# Patient Record
Sex: Male | Born: 1945 | Race: White | Hispanic: No | Marital: Married | Smoking: Never smoker
Health system: Southern US, Community
[De-identification: ages and names within clinical notes are randomized; demographics above are authoritative.]

## PROBLEM LIST (undated history)

## (undated) DIAGNOSIS — R0602 Shortness of breath: Secondary | ICD-10-CM

## (undated) DIAGNOSIS — I2510 Atherosclerotic heart disease of native coronary artery without angina pectoris: Secondary | ICD-10-CM

## (undated) DIAGNOSIS — Z951 Presence of aortocoronary bypass graft: Secondary | ICD-10-CM

## (undated) DIAGNOSIS — G4733 Obstructive sleep apnea (adult) (pediatric): Secondary | ICD-10-CM

## (undated) DIAGNOSIS — T4145XA Adverse effect of unspecified anesthetic, initial encounter: Secondary | ICD-10-CM

## (undated) DIAGNOSIS — J45909 Unspecified asthma, uncomplicated: Secondary | ICD-10-CM

## (undated) DIAGNOSIS — I214 Non-ST elevation (NSTEMI) myocardial infarction: Secondary | ICD-10-CM

## (undated) DIAGNOSIS — I10 Essential (primary) hypertension: Secondary | ICD-10-CM

## (undated) DIAGNOSIS — Z9989 Dependence on other enabling machines and devices: Secondary | ICD-10-CM

## (undated) DIAGNOSIS — T8859XA Other complications of anesthesia, initial encounter: Secondary | ICD-10-CM

## (undated) DIAGNOSIS — I350 Nonrheumatic aortic (valve) stenosis: Secondary | ICD-10-CM

## (undated) HISTORY — PX: CHOLECYSTECTOMY: SHX55

## (undated) HISTORY — PX: CARPAL TUNNEL RELEASE: SHX101

## (undated) HISTORY — PX: TRANSTHORACIC ECHOCARDIOGRAM: SHX275

## (undated) HISTORY — DX: Nonrheumatic aortic (valve) stenosis: I35.0

## (undated) HISTORY — PX: HERNIA REPAIR: SHX51

## (undated) HISTORY — PX: SHOULDER OPEN ROTATOR CUFF REPAIR: SHX2407

## (undated) HISTORY — PX: INCISION AND DRAINAGE OF WOUND: SHX1803

## (undated) HISTORY — DX: Non-ST elevation (NSTEMI) myocardial infarction: I21.4

---

## 2010-11-25 DIAGNOSIS — E6609 Other obesity due to excess calories: Secondary | ICD-10-CM | POA: Insufficient documentation

## 2012-06-14 DIAGNOSIS — N529 Male erectile dysfunction, unspecified: Secondary | ICD-10-CM | POA: Insufficient documentation

## 2013-04-08 ENCOUNTER — Encounter (HOSPITAL_COMMUNITY): Payer: Self-pay | Admitting: Emergency Medicine

## 2013-04-08 ENCOUNTER — Other Ambulatory Visit: Payer: Self-pay

## 2013-04-08 ENCOUNTER — Inpatient Hospital Stay (HOSPITAL_COMMUNITY): Payer: Medicare Other

## 2013-04-08 ENCOUNTER — Inpatient Hospital Stay (HOSPITAL_COMMUNITY)
Admission: EM | Admit: 2013-04-08 | Discharge: 2013-04-15 | DRG: 234 | Disposition: A | Discharge status: Home / Self Care | Payer: Medicare Other | Attending: Cardiothoracic Surgery | Admitting: Cardiothoracic Surgery

## 2013-04-08 ENCOUNTER — Emergency Department (HOSPITAL_COMMUNITY): Payer: Medicare Other

## 2013-04-08 ENCOUNTER — Emergency Department (HOSPITAL_COMMUNITY): Admission: EM | Admit: 2013-04-08 | Payer: Self-pay

## 2013-04-08 DIAGNOSIS — E66811 Obesity, class 1: Secondary | ICD-10-CM | POA: Diagnosis present

## 2013-04-08 DIAGNOSIS — G4730 Sleep apnea, unspecified: Secondary | ICD-10-CM | POA: Diagnosis present

## 2013-04-08 DIAGNOSIS — D62 Acute posthemorrhagic anemia: Secondary | ICD-10-CM | POA: Diagnosis not present

## 2013-04-08 DIAGNOSIS — Z6836 Body mass index (BMI) 36.0-36.9, adult: Secondary | ICD-10-CM

## 2013-04-08 DIAGNOSIS — E785 Hyperlipidemia, unspecified: Secondary | ICD-10-CM | POA: Diagnosis present

## 2013-04-08 DIAGNOSIS — Z8709 Personal history of other diseases of the respiratory system: Secondary | ICD-10-CM | POA: Diagnosis present

## 2013-04-08 DIAGNOSIS — I214 Non-ST elevation (NSTEMI) myocardial infarction: Principal | ICD-10-CM | POA: Diagnosis present

## 2013-04-08 DIAGNOSIS — Z951 Presence of aortocoronary bypass graft: Secondary | ICD-10-CM

## 2013-04-08 DIAGNOSIS — I472 Ventricular tachycardia, unspecified: Secondary | ICD-10-CM | POA: Diagnosis present

## 2013-04-08 DIAGNOSIS — T6391XA Toxic effect of contact with unspecified venomous animal, accidental (unintentional), initial encounter: Secondary | ICD-10-CM | POA: Diagnosis present

## 2013-04-08 DIAGNOSIS — Z8249 Family history of ischemic heart disease and other diseases of the circulatory system: Secondary | ICD-10-CM

## 2013-04-08 DIAGNOSIS — T63301A Toxic effect of unspecified spider venom, accidental (unintentional), initial encounter: Secondary | ICD-10-CM

## 2013-04-08 DIAGNOSIS — E860 Dehydration: Secondary | ICD-10-CM | POA: Diagnosis present

## 2013-04-08 DIAGNOSIS — R55 Syncope and collapse: Secondary | ICD-10-CM | POA: Diagnosis present

## 2013-04-08 DIAGNOSIS — T63391A Toxic effect of venom of other spider, accidental (unintentional), initial encounter: Secondary | ICD-10-CM | POA: Diagnosis present

## 2013-04-08 DIAGNOSIS — I4729 Other ventricular tachycardia: Secondary | ICD-10-CM | POA: Diagnosis present

## 2013-04-08 DIAGNOSIS — I2510 Atherosclerotic heart disease of native coronary artery without angina pectoris: Secondary | ICD-10-CM | POA: Diagnosis present

## 2013-04-08 DIAGNOSIS — I10 Essential (primary) hypertension: Secondary | ICD-10-CM | POA: Diagnosis present

## 2013-04-08 DIAGNOSIS — G4733 Obstructive sleep apnea (adult) (pediatric): Secondary | ICD-10-CM | POA: Diagnosis present

## 2013-04-08 DIAGNOSIS — D696 Thrombocytopenia, unspecified: Secondary | ICD-10-CM | POA: Diagnosis not present

## 2013-04-08 DIAGNOSIS — T679XXA Effect of heat and light, unspecified, initial encounter: Secondary | ICD-10-CM

## 2013-04-08 DIAGNOSIS — I959 Hypotension, unspecified: Secondary | ICD-10-CM | POA: Diagnosis present

## 2013-04-08 DIAGNOSIS — J45909 Unspecified asthma, uncomplicated: Secondary | ICD-10-CM | POA: Diagnosis present

## 2013-04-08 DIAGNOSIS — I25119 Atherosclerotic heart disease of native coronary artery with unspecified angina pectoris: Secondary | ICD-10-CM | POA: Diagnosis present

## 2013-04-08 DIAGNOSIS — E669 Obesity, unspecified: Secondary | ICD-10-CM | POA: Diagnosis present

## 2013-04-08 DIAGNOSIS — R000 Tachycardia, unspecified: Secondary | ICD-10-CM

## 2013-04-08 DIAGNOSIS — J9819 Other pulmonary collapse: Secondary | ICD-10-CM | POA: Diagnosis not present

## 2013-04-08 DIAGNOSIS — E876 Hypokalemia: Secondary | ICD-10-CM | POA: Diagnosis not present

## 2013-04-08 HISTORY — DX: Presence of aortocoronary bypass graft: Z95.1

## 2013-04-08 HISTORY — DX: Unspecified asthma, uncomplicated: J45.909

## 2013-04-08 HISTORY — DX: Dependence on other enabling machines and devices: Z99.89

## 2013-04-08 HISTORY — DX: Other complications of anesthesia, initial encounter: T88.59XA

## 2013-04-08 HISTORY — DX: Essential (primary) hypertension: I10

## 2013-04-08 HISTORY — DX: Atherosclerotic heart disease of native coronary artery without angina pectoris: I25.10

## 2013-04-08 HISTORY — DX: Obstructive sleep apnea (adult) (pediatric): G47.33

## 2013-04-08 HISTORY — DX: Shortness of breath: R06.02

## 2013-04-08 HISTORY — DX: Adverse effect of unspecified anesthetic, initial encounter: T41.45XA

## 2013-04-08 HISTORY — DX: Non-ST elevation (NSTEMI) myocardial infarction: I21.4

## 2013-04-08 LAB — CBC WITH DIFFERENTIAL/PLATELET
Basophils Absolute: 0 10*3/uL (ref 0.0–0.1)
Basophils Relative: 0 % (ref 0–1)
HCT: 40.1 % (ref 39.0–52.0)
Lymphocytes Relative: 9 % — ABNORMAL LOW (ref 12–46)
MCHC: 35.2 g/dL (ref 30.0–36.0)
Neutro Abs: 12.6 10*3/uL — ABNORMAL HIGH (ref 1.7–7.7)
Neutrophils Relative %: 76 % (ref 43–77)
Platelets: 159 10*3/uL (ref 150–400)
RDW: 12.7 % (ref 11.5–15.5)
WBC: 16.6 10*3/uL — ABNORMAL HIGH (ref 4.0–10.5)

## 2013-04-08 LAB — CK: Total CK: 371 U/L — ABNORMAL HIGH (ref 7–232)

## 2013-04-08 LAB — BASIC METABOLIC PANEL
BUN: 16 mg/dL (ref 6–23)
CO2: 27 mEq/L (ref 19–32)
Chloride: 105 mEq/L (ref 96–112)
Creatinine, Ser: 1.3 mg/dL (ref 0.50–1.35)
Creatinine, Ser: 1.26 mg/dL (ref 0.50–1.35)
GFR calc Af Amer: 65 mL/min — ABNORMAL LOW (ref >90)
GFR calc Af Amer: 67 mL/min — ABNORMAL LOW (ref >90)
GFR calc non Af Amer: 58 mL/min — ABNORMAL LOW (ref >90)
Potassium: 2.9 mEq/L — ABNORMAL LOW (ref 3.5–5.1)
Potassium: 3.4 mEq/L — ABNORMAL LOW (ref 3.5–5.1)
Sodium: 142 mEq/L (ref 135–145)

## 2013-04-08 LAB — TROPONIN I
Troponin I: 0.39 ng/mL (ref <0.30)
Troponin I: 0.56 ng/mL (ref <0.30)

## 2013-04-08 LAB — GLUCOSE, CAPILLARY: Glucose-Capillary: 105 mg/dL — ABNORMAL HIGH (ref 70–99)

## 2013-04-08 MED ORDER — SODIUM CHLORIDE 0.9 % IJ SOLN: 3.0000 mL | Freq: Two times a day (BID) | INTRAMUSCULAR | Status: DC

## 2013-04-08 MED ORDER — SODIUM CHLORIDE 0.9 % IV SOLN
INTRAVENOUS | Status: DC
  Administered 2013-04-08: via INTRAVENOUS

## 2013-04-08 MED ORDER — ACETAMINOPHEN 325 MG PO TABS
650.0000 mg | ORAL_TABLET | ORAL | Status: DC | PRN
  Filled 2013-04-08: qty 2

## 2013-04-08 MED ORDER — NITROGLYCERIN 0.4 MG SL SUBL: 0.4000 mg | SUBLINGUAL_TABLET | SUBLINGUAL | Status: DC | PRN

## 2013-04-08 MED ORDER — HEPARIN BOLUS VIA INFUSION
4000.0000 [IU] | Freq: Once | INTRAVENOUS | Status: AC
  Administered 2013-04-09: 4000 [IU] via INTRAVENOUS
  Filled 2013-04-08: qty 4000

## 2013-04-08 MED ORDER — ASPIRIN EC 81 MG PO TBEC
81.0000 mg | DELAYED_RELEASE_TABLET | Freq: Every day | ORAL | Status: DC
  Administered 2013-04-09 – 2013-04-10 (×2): 81 mg via ORAL
  Filled 2013-04-08 (×3): qty 1

## 2013-04-08 MED ORDER — FENOFIBRATE 160 MG PO TABS
160.0000 mg | ORAL_TABLET | Freq: Every day | ORAL | Status: DC
  Administered 2013-04-09 – 2013-04-15 (×6): 160 mg via ORAL
  Filled 2013-04-08 (×7): qty 1

## 2013-04-08 MED ORDER — ONDANSETRON HCL 4 MG/2ML IJ SOLN
4.0000 mg | Freq: Four times a day (QID) | INTRAMUSCULAR | Status: DC | PRN
  Filled 2013-04-08: qty 2

## 2013-04-08 MED ORDER — METHYLPREDNISOLONE SODIUM SUCC 125 MG IJ SOLR
125.0000 mg | Freq: Once | INTRAMUSCULAR | Status: AC
  Administered 2013-04-08: 125 mg via INTRAVENOUS
  Filled 2013-04-08: qty 2

## 2013-04-08 MED ORDER — MONTELUKAST SODIUM 10 MG PO TABS
10.0000 mg | ORAL_TABLET | Freq: Every day | ORAL | Status: DC
  Administered 2013-04-09 – 2013-04-14 (×5): 10 mg via ORAL
  Filled 2013-04-08 (×8): qty 1

## 2013-04-08 MED ORDER — FLUTICASONE PROPIONATE 50 MCG/ACT NA SUSP
2.0000 | Freq: Every day | NASAL | Status: DC
  Administered 2013-04-09 – 2013-04-15 (×6): 2 via NASAL
  Filled 2013-04-08 (×2): qty 16

## 2013-04-08 MED ORDER — FAMOTIDINE IN NACL 20-0.9 MG/50ML-% IV SOLN
20.0000 mg | Freq: Once | INTRAVENOUS | Status: AC
  Administered 2013-04-08: 20 mg via INTRAVENOUS
  Filled 2013-04-08: qty 50

## 2013-04-08 MED ORDER — SODIUM CHLORIDE 0.9 % IV BOLUS (SEPSIS)
1000.0000 mL | Freq: Once | INTRAVENOUS | Status: AC
  Administered 2013-04-08: 1000 mL via INTRAVENOUS

## 2013-04-08 MED ORDER — DIPHENHYDRAMINE HCL 50 MG/ML IJ SOLN
25.0000 mg | Freq: Once | INTRAMUSCULAR | Status: AC
  Administered 2013-04-08: 25 mg via INTRAVENOUS
  Filled 2013-04-08: qty 1

## 2013-04-08 MED ORDER — SODIUM CHLORIDE 0.9 % IJ SOLN: 3.0000 mL | INTRAMUSCULAR | Status: DC | PRN

## 2013-04-08 MED ORDER — POTASSIUM CHLORIDE CRYS ER 20 MEQ PO TBCR
40.0000 meq | EXTENDED_RELEASE_TABLET | Freq: Once | ORAL | Status: AC
  Administered 2013-04-08: 40 meq via ORAL
  Filled 2013-04-08: qty 2

## 2013-04-08 MED ORDER — ALBUTEROL SULFATE HFA 108 (90 BASE) MCG/ACT IN AERS
2.0000 | INHALATION_SPRAY | Freq: Four times a day (QID) | RESPIRATORY_TRACT | Status: DC | PRN
Start: 2013-04-08 — End: 2013-04-15

## 2013-04-08 MED ORDER — HEPARIN (PORCINE) IN NACL 100-0.45 UNIT/ML-% IJ SOLN
1850.0000 [IU]/h | INTRAMUSCULAR | Status: DC
  Administered 2013-04-09: 1700 [IU]/h via INTRAVENOUS
  Filled 2013-04-08 (×3): qty 250

## 2013-04-08 MED ORDER — SODIUM CHLORIDE 0.9 % IV SOLN: 250.0000 mL | INTRAVENOUS | Status: DC | PRN

## 2013-04-08 NOTE — ED Notes (Signed)
Per EMS pt was working outside and got stung by unknown amount of bees. Pt was found inside by family in chair unresponsive.  Pt now lethargic and unable to recall what happened.  Pt has 18g in right AC, 20g in left wrist.  Pt has no previous allergy to bees.  Pt was sinus tach on EMS initial arrival at scene, pt HR decreased to 103bpm and RR rate has decreased since arrival at scene. Pt was also diaphoretic at scene which has subsided now.  Pt is nauseated and received 8mg  zofran in route, without any relief.  Denies any respiratory difficulty.

## 2013-04-08 NOTE — ED Provider Notes (Signed)
CSN: [id number]     Arrival date & time 04/08/13  1352 History   First MD Initiated Contact with Patient 04/08/13 1500     Chief Complaint  Patient presents with  . Fatigue  . Loss of Consciousness  . Insect Bite    bee stings  . Nausea   (Consider location/radiation/quality/duration/timing/severity/associated sxs/prior Treatment) HPI  Patient and his wife report they were working today to help her sister move. They were up in the attic and moving items out of the house and loading them into a trailer. She reports they've been outside working a lot and it was free hot today (it was 93). At one point he picked up a container of plant clippings and he reports yellow jackets came out and started stinging him. He relates he was stung all over. He states he ran into the house. He states he weighs into the dining room and sat down and got something to drink. He states he got short of breath. He denies any itching. His family then noted he was not responding. They put him on the floor. His wife states his face was red. She states he was breathing and did have a pulse. She states he was sweating profusely however they had been out working in the heat. Patient states he remembers EMS arrival and shortly afterwards he did have vomiting. He did have a headache but it's gone now. He denies any chest pain before he had the syncopal episode or after. He complains of a dry mouth and he states after he got to the ER he was having chills. He was treated by EMS with Zofran. He denied feeling he was having any difficulty swallowing or having swelling in his throat or his tongue. He has some puffiness around his eyes but his wife states that the way he normally looks. He has no complaints of angioedema. He denies any itching. He has a couple small red areas consistent with bee stings. EMS reported some diffuse EKG changes which resolved in route to the ED.  PCP Dr Kern Reap in [facility]  Past Medical History  Diagnosis Date   . Asthma    Past Surgical History  Procedure Laterality Date  . Shoulder surgery    . Cholecystectomy    . Hernia repair    . Elbow surgery     No family history on file. History  Substance Use Topics  . Smoking status: Never Smoker   . Smokeless tobacco: Not on file  . Alcohol Use: No  retired Lives at home Lives with spouse  Review of Systems  All other systems reviewed and are negative.    Allergies  Review of patient's allergies indicates no known allergies.  Home Medications   Current Outpatient Rx  Name  Route  Sig  Dispense  Refill  . albuterol (PROVENTIL HFA;VENTOLIN HFA) 108 (90 BASE) MCG/ACT inhaler   Inhalation   Inhale 2 puffs into the lungs every 6 (six) hours as needed for wheezing or shortness of breath.         [name] albuterol (PROVENTIL) (2.5 MG/3ML) 0.083% nebulizer solution   Nebulization   Take 2.5 mg by nebulization every 6 (six) hours as needed for wheezing or shortness of breath.         . fenofibrate 160 MG tablet   Oral   Take 160 mg by mouth daily.         . fluticasone (FLONASE) 50 MCG/ACT nasal spray   Nasal  Place 2 sprays into the nose daily.         . hydrochlorothiazide (HYDRODIURIL) 25 MG tablet   Oral   Take 25 mg by mouth daily.         . montelukast (SINGULAIR) 10 MG tablet   Oral   Take 10 mg by mouth every morning.           BP 146/106  Pulse 111  Temp(Src) 98.1 F (36.7 C) (Oral)  Resp 20  SpO2 96%  Vital signs normal tachycardia  Physical Exam  Nursing note and vitals reviewed. Constitutional: He is oriented to person, place, and time. He appears well-developed and well-nourished.  Non-toxic appearance. He does not appear ill. No distress.  obese  HENT:  Head: Normocephalic and atraumatic.  Right Ear: External ear normal.  Left Ear: External ear normal.  Nose: Nose normal. No mucosal edema or rhinorrhea.  Mouth/Throat: Oropharynx is clear and moist and mucous membranes are normal. No dental  abscesses or edematous.  Eyes: Conjunctivae and EOM are normal. Pupils are equal, round, and reactive to light.  Neck: Normal range of motion and full passive range of motion without pain. Neck supple.  Cardiovascular: Normal rate, regular rhythm and normal heart sounds.  Exam reveals no gallop and no friction rub.   No murmur heard. Pulmonary/Chest: Effort normal and breath sounds normal. No respiratory distress. He has no wheezes. He has no rhonchi. He has no rales. He exhibits no tenderness and no crepitus.  Abdominal: Soft. Normal appearance and bowel sounds are normal. He exhibits no distension. There is no tenderness. There is no rebound and no guarding.  Musculoskeletal: Normal range of motion. He exhibits no edema and no tenderness.  Moves all extremities well.   Neurological: He is alert and oriented to person, place, and time. He has normal strength. No cranial nerve deficit.  Skin: Skin is warm, dry and intact. No rash noted. No erythema. No pallor.  Patient has an area of redness in his left temple, one small area on his left posterior back.  Psychiatric: He has a normal mood and affect. His speech is normal and behavior is normal. His mood appears not anxious.    ED Course  Procedures (including critical care time)  Medications  sodium chloride 0.9 % bolus 1,000 mL (0 mLs Intravenous Stopped 04/08/13 1622)  diphenhydrAMINE (BENADRYL) injection 25 mg (25 mg Intravenous Given 04/08/13 1617)  famotidine (PEPCID) IVPB 20 mg (0 mg Intravenous Stopped 04/08/13 1722)  methylPREDNISolone sodium succinate (SOLU-MEDROL) 125 mg/2 mL injection 125 mg (125 mg Intravenous Given 04/08/13 1619)  potassium chloride SA (K-DUR,KLOR-CON) CR tablet 40 mEq (40 mEq Oral Given 04/08/13 1722)    Discussed results of his initial troponin. Patient again denies having any chest pain prior to his syncopal episode or after. He does not have a history of heart problems.   He given results of his second troponin and  need to be admitted for further evaluation. He could have underlying CAD and with the hypotension when he had syncope had some ischemia.  Pt's syncope could have been from the bee stings although he had no itching, swelling in his throat or tongue and did not receive treatment for anaphylaxis by EMS when he presented to the ED and felt better. He has been working in the heat all day and specifically an attic and had dehydration and heat exhaustion as possible underlying etiologies for his syncope.   Pt's hypokalemia was treated with oral meds since  he was not having any more nausea.  20:20 Dr Mayford Knife here in the ED and is going to see the patient.   20:38 Dr Mayford Knife wants a head CT to be done and patient to be transferred to [facility] tele for Dr Tresa Endo attending.   Labs Review  Results for orders placed during the hospital encounter of 04/08/13  TROPONIN I      Result Value Range   Troponin I 0.39 (*) <0.30 ng/mL  CK      Result Value Range   Total CK 371 (*) 7 - 232 U/L  CBC WITH DIFFERENTIAL      Result Value Range   WBC 16.6 (*) 4.0 - 10.5 K/uL   RBC 4.65  4.22 - 5.81 MIL/uL   Hemoglobin 14.1  13.0 - 17.0 g/dL   HCT 16.1  09.6 - 04.5 %   MCV 86.2  78.0 - 100.0 fL   MCH 30.3  26.0 - 34.0 pg   MCHC 35.2  30.0 - 36.0 g/dL   RDW 40.9  81.1 - 91.4 %   Platelets 159  150 - 400 K/uL   Neutrophils Relative % 76  43 - 77 %   Neutro Abs 12.6 (*) 1.7 - 7.7 K/uL   Lymphocytes Relative 9 (*) 12 - 46 %   Lymphs Abs 1.5  0.7 - 4.0 K/uL   Monocytes Relative 15 (*) 3 - 12 %   Monocytes Absolute 2.5 (*) 0.1 - 1.0 K/uL   Eosinophils Relative 0  0 - 5 %   Eosinophils Absolute 0.0  0.0 - 0.7 K/uL   Basophils Relative 0  0 - 1 %   Basophils Absolute 0.0  0.0 - 0.1 K/uL  BASIC METABOLIC PANEL      Result Value Range   Sodium 142  135 - 145 mEq/L   Potassium 2.9 (*) 3.5 - 5.1 mEq/L   Chloride 105  96 - 112 mEq/L   CO2 27  19 - 32 mEq/L   Glucose, Bld 109 (*) 70 - 99 mg/dL   BUN 17  6 - 23 mg/dL    Creatinine, Ser 7.82  0.50 - 1.35 mg/dL   Calcium 9.5  8.4 - 95.6 mg/dL   GFR calc non Af Amer 56 (*) >90 mL/min   GFR calc Af Amer 65 (*) >90 mL/min  TROPONIN I      Result Value Range   Troponin I 0.56 (*) <0.30 ng/mL  GLUCOSE, CAPILLARY      Result Value Range   Glucose-Capillary 105 (*) 70 - 99 mg/dL   Laboratory interpretation all normal except + troponin that is rising.    Dg Chest Portable 1 View  04/08/2013   *RADIOLOGY REPORT*  Clinical Data: Syncope  PORTABLE CHEST - 1 VIEW  Comparison: None  Findings: Heart size upper normal.  Vascularity is normal.  Lungs are free of infiltrate effusion or mass.  IMPRESSION: No active cardiopulmonary abnormality.   Original Report Authenticated By: Janeece Riggers, M.D.    Ct Head Wo Contrast  04/08/2013   *RADIOLOGY REPORT*  Clinical Data: Fall with loss of consciousness.  CT HEAD WITHOUT CONTRAST  Technique:  Contiguous axial images were obtained from the base of the skull through the vertex without contrast.  Comparison: None.  Findings: Bone windows demonstrate no significant soft tissue swelling.  No skull fracture.  Clear paranasal sinuses and mastoid air cells.  Soft tissue windows demonstrate no  mass lesion, hemorrhage, hydrocephalus, acute infarct, intra-axial, or extra-axial  fluid collection.  IMPRESSION:  No acute intracranial abnormality.   Original Report Authenticated By: Jeronimo Greaves, M.D.     #1  Date: 04/08/2013  Rate: 99  Rhythm: normal sinus rhythm  QRS Axis: normal  Intervals: normal  ST/T Wave abnormalities: nonspecific T wave changes  Conduction Disutrbances:nonspecific intraventricular conduction delay  Narrative Interpretation:   Old EKG Reviewed: none available  #2  Date: 04/08/2013  Rate: 108  Rhythm: sinus tachycardia  QRS Axis: normal  Intervals: PR prolonged  ST/T Wave abnormalities: normal  Conduction Disutrbances:none  Narrative Interpretation:   Old EKG Reviewed: changes noted NSTWC no longer present.     EMS EKG 12:31  Date: 04/08/2013  Rate: 144  Rhythm: sinus tachycardia  QRS Axis: normal  Intervals: normal  ST/T Wave abnormalities: ST depression in septal, inferolat leads, ST elevation aVR  Conduction Disutrbances:none  Narrative Interpretation:   Old EKG Reviewed: none available     MDM   1. Syncopal episodes   2. NSTEMI (non-ST elevated myocardial infarction)   3. Heat exposure     Plan transfer to [facility].   Devoria Albe, MD, FACEP   CRITICAL CARE Performed by: Ward Givens Total critical care time: 35 min Critical care time was exclusive of separately billable procedures and treating other patients. Critical care was necessary to treat or prevent imminent or life-threatening deterioration. Critical care was time spent personally by me on the following activities: development of treatment plan with patient and/or surrogate as well as nursing, discussions with consultants, evaluation of patient's response to treatment, examination of patient, obtaining history from patient or surrogate, ordering and performing treatments and interventions, ordering and review of laboratory studies, ordering and review of radiographic studies, pulse oximetry and re-evaluation of patient's condition.    Ward Givens, MD 04/08/13 2325

## 2013-04-08 NOTE — ED Notes (Signed)
Per EMS pt was working out in yard and was stung by unknown amount/times from bees and was found unresponsive in chair by family member.  Pt was sinus tach on arrival at scene as well as tachypnea. Pt denies any respiratory distress.  Pt has 18g in right AC and 20g in left wrist.  Pt also c/o nausea and given zofran 8mg  in route but pt denying relief.  Pt did so some depression on EKG ran by EMS but subsided by time to arrived here on their EKG.

## 2013-04-08 NOTE — H&P (Addendum)
Admit date: 04/08/2013 Referring Physician Dr. Lynelle Doctor Primary Cardiologist NONE Chief complaint/reason for admission: syncope after bee stings  HPI: This is a 67yo obese WM in his USOH when he was over at his sister's house working outside.  He picked up a bucket filled with yellow jackets and got stung multiple times.  He became diaphoretic and went and sat down.  He started to drink a coke and felt nauseated and the next thing he knew he was lying on the floor with EMS there.  He denied any chest pain or SOB prior to the event.  He denies any chest pain in the past but does have DOE which he attributes to his obesity.  He denied any palpitations but when EMS got there he was tachycardic at 140bpm with St depression in the anterolateral precordial leads.  The ST depression resolved when the tachycardia resolved.  Currently he feels back to normal.  He was only given Zofran for nausea.  Cardiology was called due to elevated troponin.    PMH:    Past Medical History  Diagnosis Date  . Asthma     PSH:    Past Surgical History  Procedure Laterality Date  . Shoulder surgery    . Cholecystectomy    . Hernia repair    . Elbow surgery      ALLERGIES:   Review of patient's allergies indicates no known allergies.  Prior to Admit Meds:   (Not in a hospital admission) Family HX:   No family history on file. Social HX:    History   Social History  . Marital Status: Married    Spouse Name: N/A    Number of Children: N/A  . Years of Education: N/A   Occupational History  . Not on file.   Social History Main Topics  . Smoking status: Never Smoker   . Smokeless tobacco: Not on file  . Alcohol Use: No  . Drug Use: Not on file  . Sexual Activity: Not on file   Other Topics Concern  . Not on file   Social History Narrative  . No narrative on file     ROS:  All 11 ROS were addressed and are negative except what is stated in the HPI  PHYSICAL EXAM Filed Vitals:   04/08/13 1949  BP:  172/93  Pulse: 111  Temp: 97.9 F (36.6 C)  Resp: 23   General: Well developed, well nourished, in no acute distress Head: Eyes PERRLA, No xanthomas.   Normal cephalic and atramatic  Lungs:   Clear bilaterally to auscultation and percussion. Heart:   HRRR S1 S2 Pulses are 2+ & equal.2/6 SM at RUSB to LLSB            No carotid bruit. No JVD.  No abdominal bruits. No femoral bruits. Abdomen: Bowel sounds are positive, abdomen soft and non-tender without masses  Extremities:   No clubbing, cyanosis or edema.  DP +1 Neuro: Alert and oriented X 3. Psych:  Good affect, responds appropriately   Labs:   Lab Results  Component Value Date   WBC 16.6* 04/08/2013   HGB 14.1 04/08/2013   HCT 40.1 04/08/2013   MCV 86.2 04/08/2013   PLT 159 04/08/2013    Recent Labs Lab 04/08/13 1558  NA 142  K 2.9*  CL 105  CO2 27  BUN 17  CREATININE 1.30  CALCIUM 9.5  GLUCOSE 109*   Lab Results  Component Value Date   CKTOTAL 371* 04/08/2013  TROPONINI 0.56* 04/08/2013       Radiology:  pending  EKG:  #1 sinus tachycardia at 144bpm with ST depression in the anterolateral leads             #2 NSR with no ST changes  ASSESSMENT:  1.  Syncope most likely secondary to hypotension from dehydration +/- acute anaphylaxis to bee sting.  He had been working out in the extreme heat and became nauseated.   2.  Multiple bee stings - no evidence of anaphylaxis at this time and did not receive anything for acute allergy 3.  Elevated troponin most likely secondary to #1 but ST depression during sinus tachycardia concerning for underlying CAD with coronary ischemia 4. Obesity 5.  Heart murmur 6.  Hypokalemia secondary to dehydration 7.  Elevated WBC ? Stress induced   PLAN:   1.  Admit to tele 2.  Cycle cardiac enzymes 3.  Replete potassium 3.  2D echo eval heart murmur 4.  IV Heparin gtt until rule out complete 5.  NPO after midnight 6.  Further workup in am per Dr. Dorthey Sawyer, MD  04/08/2013   8:27 PM

## 2013-04-08 NOTE — Progress Notes (Signed)
Patient confirms his pcp is Dr. Ardelle Balls in [place].

## 2013-04-08 NOTE — Progress Notes (Signed)
ANTICOAGULATION CONSULT NOTE - Initial Consult  Pharmacy Consult for heparin  Indication: chest pain/ACS  No Known Allergies  Patient Measurements: Height: 5\' 10"  (177.8 cm) Weight: 265 lb 12.8 oz (120.566 kg) IBW/kg (Calculated) : 73 Heparin Dosing Weight:   Vital Signs: Temp: 97.8 F (36.6 C) (09/02 2329) Temp src: Oral (09/02 1949) BP: 152/70 mmHg (09/02 2329) Pulse Rate: 104 (09/02 2329)  Labs:  Recent Labs  04/08/13 1558 04/08/13 1746  HGB 14.1  --   HCT 40.1  --   PLT 159  --   CREATININE 1.30 1.26  CKTOTAL 371*  --   TROPONINI 0.39* 0.56*    Estimated Creatinine Clearance: 76.1 ml/min (by C-G formula based on Cr of 1.26).   Medical History: Past Medical History  Diagnosis Date  . Asthma     Medications:  Prescriptions prior to admission  Medication Sig Dispense Refill  . albuterol (PROVENTIL HFA;VENTOLIN HFA) 108 (90 BASE) MCG/ACT inhaler Inhale 2 puffs into the lungs every 6 (six) hours as needed for wheezing or shortness of breath.      [name] albuterol (PROVENTIL) (2.5 MG/3ML) 0.083% nebulizer solution Take 2.5 mg by nebulization every 6 (six) hours as needed for wheezing or shortness of breath.      . fenofibrate 160 MG tablet Take 160 mg by mouth daily.      . fluticasone (FLONASE) 50 MCG/ACT nasal spray Place 2 sprays into the nose daily.      . hydrochlorothiazide (HYDRODIURIL) 25 MG tablet Take 25 mg by mouth daily.      . montelukast (SINGULAIR) 10 MG tablet Take 10 mg by mouth every morning.         Assessment: 67 yo obese male with asthma was stung by multiple yellow jackkets while working outdoors. With sycopal episode-passsed out and tachy in the 140's with st depression troponin elevated. Heparin until r/o. No anticoags PTA Goal of Therapy:  Heparin level 0.3-0.7 units/ml Monitor platelets by anticoagulation protocol: Yes   Plan:  Heparin 4000 unitsx1 then 1700 units/hr. HL in 6 hours. Daily HL and cbc starting 9/4   Janice Coffin 04/08/2013,11:44 PM

## 2013-04-09 ENCOUNTER — Other Ambulatory Visit: Payer: Self-pay | Admitting: *Deleted

## 2013-04-09 ENCOUNTER — Encounter (HOSPITAL_COMMUNITY)
Admission: EM | Disposition: A | Discharge status: Home / Self Care | Payer: Medicare Other | Source: Home / Self Care | Attending: Cardiothoracic Surgery

## 2013-04-09 DIAGNOSIS — I517 Cardiomegaly: Secondary | ICD-10-CM

## 2013-04-09 DIAGNOSIS — R000 Tachycardia, unspecified: Secondary | ICD-10-CM | POA: Insufficient documentation

## 2013-04-09 DIAGNOSIS — I472 Ventricular tachycardia: Secondary | ICD-10-CM | POA: Diagnosis present

## 2013-04-09 DIAGNOSIS — I2510 Atherosclerotic heart disease of native coronary artery without angina pectoris: Secondary | ICD-10-CM

## 2013-04-09 DIAGNOSIS — I4729 Other ventricular tachycardia: Secondary | ICD-10-CM | POA: Insufficient documentation

## 2013-04-09 DIAGNOSIS — E669 Obesity, unspecified: Secondary | ICD-10-CM | POA: Diagnosis present

## 2013-04-09 DIAGNOSIS — R55 Syncope and collapse: Secondary | ICD-10-CM | POA: Diagnosis present

## 2013-04-09 DIAGNOSIS — I214 Non-ST elevation (NSTEMI) myocardial infarction: Secondary | ICD-10-CM | POA: Diagnosis present

## 2013-04-09 HISTORY — PX: CARDIAC CATHETERIZATION: SHX172

## 2013-04-09 HISTORY — PX: TRANSTHORACIC ECHOCARDIOGRAM: SHX275

## 2013-04-09 HISTORY — PX: LEFT HEART CATHETERIZATION WITH CORONARY ANGIOGRAM: SHX5451

## 2013-04-09 LAB — CBC
HCT: 37.8 % — ABNORMAL LOW (ref 39.0–52.0)
Hemoglobin: 14 g/dL (ref 13.0–17.0)
WBC: 19.2 10*3/uL — ABNORMAL HIGH (ref 4.0–10.5)

## 2013-04-09 LAB — COMPREHENSIVE METABOLIC PANEL
BUN: 18 mg/dL (ref 6–23)
Calcium: 9.5 mg/dL (ref 8.4–10.5)
GFR calc Af Amer: 64 mL/min — ABNORMAL LOW (ref >90)
Glucose, Bld: 202 mg/dL — ABNORMAL HIGH (ref 70–99)
Sodium: 144 mEq/L (ref 135–145)
Total Protein: 7 g/dL (ref 6.0–8.3)

## 2013-04-09 LAB — CBC WITH DIFFERENTIAL/PLATELET
Basophils Relative: 0 % (ref 0–1)
Eosinophils Absolute: 0 10*3/uL (ref 0.0–0.7)
Eosinophils Relative: 0 % (ref 0–5)
Lymphs Abs: 1.1 10*3/uL (ref 0.7–4.0)
MCH: 30.9 pg (ref 26.0–34.0)
MCHC: 35.8 g/dL (ref 30.0–36.0)
MCV: 86.3 fL (ref 78.0–100.0)
Monocytes Relative: 1 % — ABNORMAL LOW (ref 3–12)
Platelets: 165 10*3/uL (ref 150–400)
RBC: 4.6 MIL/uL (ref 4.22–5.81)

## 2013-04-09 LAB — PROTIME-INR
INR: 1.26 (ref 0.00–1.49)
Prothrombin Time: 15.5 seconds — ABNORMAL HIGH (ref 11.6–15.2)

## 2013-04-09 LAB — TROPONIN I: Troponin I: 0.47 ng/mL (ref <0.30)

## 2013-04-09 SURGERY — LEFT HEART CATHETERIZATION WITH CORONARY ANGIOGRAM
Anesthesia: LOCAL

## 2013-04-09 MED ORDER — SODIUM CHLORIDE 0.9 % IV SOLN: 250.0000 mL | INTRAVENOUS | Status: DC | PRN

## 2013-04-09 MED ORDER — VERAPAMIL HCL 2.5 MG/ML IV SOLN
INTRAVENOUS | Status: AC
  Filled 2013-04-09: qty 2

## 2013-04-09 MED ORDER — HEPARIN SODIUM (PORCINE) 1000 UNIT/ML IJ SOLN
INTRAMUSCULAR | Status: AC
  Filled 2013-04-09: qty 1

## 2013-04-09 MED ORDER — HEPARIN (PORCINE) IN NACL 100-0.45 UNIT/ML-% IJ SOLN
1850.0000 [IU]/h | INTRAMUSCULAR | Status: DC
  Administered 2013-04-10: 1850 [IU]/h via INTRAVENOUS
  Filled 2013-04-09 (×2): qty 250

## 2013-04-09 MED ORDER — SODIUM CHLORIDE 0.9 % IJ SOLN: 3.0000 mL | INTRAMUSCULAR | Status: DC | PRN

## 2013-04-09 MED ORDER — FENTANYL CITRATE 0.05 MG/ML IJ SOLN
INTRAMUSCULAR | Status: AC
  Filled 2013-04-09: qty 2

## 2013-04-09 MED ORDER — MORPHINE SULFATE 2 MG/ML IJ SOLN: 2.0000 mg | INTRAMUSCULAR | Status: DC | PRN

## 2013-04-09 MED ORDER — LIDOCAINE HCL (PF) 1 % IJ SOLN
INTRAMUSCULAR | Status: AC
  Filled 2013-04-09: qty 30

## 2013-04-09 MED ORDER — HEPARIN (PORCINE) IN NACL 2-0.9 UNIT/ML-% IJ SOLN
INTRAMUSCULAR | Status: AC
  Filled 2013-04-09: qty 1000

## 2013-04-09 MED ORDER — HEPARIN (PORCINE) IN NACL 100-0.45 UNIT/ML-% IJ SOLN
1850.0000 [IU]/h | INTRAMUSCULAR | Status: DC
  Filled 2013-04-09: qty 250

## 2013-04-09 MED ORDER — ATORVASTATIN CALCIUM 80 MG PO TABS
80.0000 mg | ORAL_TABLET | Freq: Every day | ORAL | Status: DC
Start: 2013-04-09 — End: 2013-04-15
  Administered 2013-04-09 – 2013-04-14 (×5): 80 mg via ORAL
  Filled 2013-04-09 (×8): qty 1

## 2013-04-09 MED ORDER — SODIUM CHLORIDE 0.9 % IJ SOLN: 3.0000 mL | Freq: Two times a day (BID) | INTRAMUSCULAR | Status: DC

## 2013-04-09 MED ORDER — MIDAZOLAM HCL 2 MG/2ML IJ SOLN
INTRAMUSCULAR | Status: AC
  Filled 2013-04-09: qty 2

## 2013-04-09 MED ORDER — SODIUM CHLORIDE 0.9 % IV SOLN
1.0000 mL/kg/h | INTRAVENOUS | Status: AC
  Administered 2013-04-09: 1 mL/kg/h via INTRAVENOUS

## 2013-04-09 MED ORDER — HEPARIN (PORCINE) IN NACL 100-0.45 UNIT/ML-% IJ SOLN
1850.0000 [IU]/h | INTRAMUSCULAR | Status: DC
  Filled 2013-04-09 (×3): qty 250

## 2013-04-09 MED ORDER — CARVEDILOL 3.125 MG PO TABS
3.1250 mg | ORAL_TABLET | Freq: Two times a day (BID) | ORAL | Status: DC
  Administered 2013-04-09 – 2013-04-10 (×3): 3.125 mg via ORAL
  Filled 2013-04-09 (×6): qty 1

## 2013-04-09 MED ORDER — NITROGLYCERIN 0.2 MG/ML ON CALL CATH LAB
INTRAVENOUS | Status: AC
  Filled 2013-04-09: qty 1

## 2013-04-09 NOTE — H&P (View-Only) (Signed)
[facility]  Subjective: Denies any chest pain. Only notes SOB with exertion.   Objective: Vital signs in last 24 hours: Temp:  [95.6 F (35.3 C)-98.2 F (36.8 C)] 98.2 F (36.8 C) (09/03 0336) Pulse Rate:  [95-111] 104 (09/02 2329) Resp:  [20-23] 20 (09/03 0336) BP: (125-172)/(66-106) 125/66 mmHg (09/03 0336) SpO2:  [96 %-99 %] 97 % (09/03 0336) Weight:  [265 lb 12.8 oz (120.566 kg)] 265 lb 12.8 oz (120.566 kg) (09/02 2329) Last BM Date: 04/08/13  Intake/Output from previous day:   Intake/Output this shift:    Medications Current Facility-Administered Medications  Medication Dose Route Frequency Provider Last Rate Last Dose  . 0.9 %  sodium chloride infusion   Intravenous Continuous Iva L Knapp, MD 100 mL/hr at 04/08/13 2337    . 0.9 %  sodium chloride infusion  250 mL Intravenous PRN Traci R Turner, MD      . acetaminophen (TYLENOL) tablet 650 mg  650 mg Oral Q4H PRN Traci R Turner, MD      . albuterol (PROVENTIL HFA;VENTOLIN HFA) 108 (90 BASE) MCG/ACT inhaler 2 puff  2 puff Inhalation Q6H PRN Traci R Turner, MD      . aspirin EC tablet 81 mg  81 mg Oral Daily Traci R Turner, MD      . fenofibrate tablet 160 mg  160 mg Oral Daily Traci R Turner, MD      . fluticasone (FLONASE) 50 MCG/ACT nasal spray 2 spray  2 spray Each Nare Daily Traci R Turner, MD      . heparin ADULT infusion 100 units/mL (25000 units/250 mL)  1,850 Units/hr Intravenous Continuous Thomas A Kelly, MD 18.5 mL/hr at 04/09/13 0728 1,850 Units/hr at 04/09/13 0728  . montelukast (SINGULAIR) tablet 10 mg  10 mg Oral QHS Traci R Turner, MD      . nitroGLYCERIN (NITROSTAT) SL tablet 0.4 mg  0.4 mg Sublingual Q5 Min x 3 PRN Traci R Turner, MD      . ondansetron (ZOFRAN) injection 4 mg  4 mg Intravenous Q6H PRN Traci R Turner, MD      . sodium chloride 0.9 % injection 3 mL  3 mL Intravenous Q12H Traci R Turner, MD      . sodium chloride 0.9 % injection 3 mL  3 mL Intravenous PRN Traci  R Turner, MD        PE: General appearance: alert, cooperative and no distress Neck: no carotid bruit Lungs: clear to auscultation bilaterally Heart: regular rate and rhythm, 2/6 SM at LUSB Abdomen: soft, non-tender; bowel sounds normal; no masses,  no organomegaly Extremities: no LEE Pulses: 2+ and symmetric Skin: warm and dry Neurologic: Grossly normal  Lab Results:   Recent Labs  04/08/13 1558 04/09/13 0009 04/09/13 0618  WBC 16.6* 13.9* 19.2*  HGB 14.1 14.2 14.0  HCT 40.1 39.7 37.8*  PLT 159 165 169   BMET  Recent Labs  04/08/13 1558 04/08/13 1746 04/09/13 0009  NA 142 140 144  K 2.9* 3.4* 3.6  CL 105 104 107  CO2 27 24 21  GLUCOSE 109* 117* 202*  BUN 17 16 18  CREATININE 1.30 1.26 1.32  CALCIUM 9.5 9.2 9.5   Cardiac Panel (last 3 results)  Recent Labs  04/08/13 1558 04/08/13 1746 04/09/13 0009 04/09/13 0618  CKTOTAL 371*  --   --   --   TROPONINI 0.39* 0.56* 0.47* <0.30    Assessment/Plan  Principal Problem:   NSTEMI (non-ST   elevated myocardial infarction) Active Problems:   Sinus tachycardia   Obesity, unspecified  Plan: S/p syncope. ? If secondary to anaphylaxis and dehydration vs NSTEMI. Troponin positive x 3 and peaked at 0.56. He denies any chest pain currently or prior to event yesterday. He has positive risk factors including obesity and family history of CAD/early MI. He has also had short burst of NSVT on telemetry.  Pt will likely need a diagnostic LHC to evaluate for ischemia. MD to follow.  Results of 2D echo pending.    LOS: 1 day    Shriyans Kuenzi M. Symphani Eckstrom, PA-C 04/09/2013 9:17 AM  I have seen and evaluated the patient this AM along with Brittany Chelsye Suhre, PA. I agree with her findings, examination as well as impression recommendations. Admitted by On-call Fellow for Dr. Kelly (although the pt was truly unassigned).   Very interesting scenario -- strong FH, HTN, HLD, OSA-CPAP & morbid obesity (?Metabolic Syndrome); has noted  increased DOE over past few months - believing it to be due to Obesity --> was helping sister-in-law moving yesterday in extreme heat & disrupted a nest of hornets, suffering multiple stings. Was hot & flushed, quite stressed --> went inside to "cool down" & was found down /unresponsive on the floor.  Awoke to EMS.   No Epi administered. --> to ER (en-route, EMS noted Stachy with ST-changes concerning for ishcemia).  IN ER felt better after benadryl & solumedrol (never had wheezing, SOB or CP, palpitations).  Troponin on initial eval was elevated - slow decline since.    No further Sx since arrival & feels well today.  Echo just completed (will need to review). Tele just as I was walking in demonstrated ~8-10 beat run of NSVT.  Impression:  Syncope with + troponin after stressful event is concerning for possible Takotsubo CM, however with VT on tele & his CRFs, cannot discount possibility of VT as etiology of syncope with a true Dx of NSTEMI.  Given his obesity & high likelihood of poor quality (2 day) Myoview, I feel that the most definitive means to delineate his anatomy is via invasive evaluation with Cardiac Cath / Angiography.    Performing MD:  HARDING,DAVID W, M.D., M.S.  Procedure:  LEFT HEART CATHETERIZATION WITH CORONARY ANGIOGRAPHY +/- PERCUTANEOUS CORONARY INTERVENTION.  The procedure with Risks/Benefits/Alternatives and Indications was reviewed with the patient and wife.  All questions were answered.    Risks / Complications include, but not limited to: Death, MI, CVA/TIA, VF/VT (with defibrillation), Bradycardia (need for temporary pacer placement), contrast induced nephropathy, bleeding / bruising / hematoma / pseudoaneurysm, vascular or coronary injury (with possible emergent CT or Vascular Surgery), adverse medication reactions, infection.    The patient and wife voice understanding and agree to proceed.    MD Time with pt: 20 min  HARDING,DAVID W, M.D., M.S. [facility] [address] [place], [place]  [address]  [phone] Pager # [phone] 04/09/2013 10:44 AM     

## 2013-04-09 NOTE — Progress Notes (Deleted)
  Echocardiogram  2D Echocardiogram has been performed.  David Hogan 04/09/2013, 1:26 PM

## 2013-04-09 NOTE — Progress Notes (Signed)
Echocardiogram 2D Echocardiogram has been performed.  Dorothey Baseman 04/09/2013, 1:25 PM
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
## 2013-04-09 NOTE — Interval H&P Note (Signed)
History and Physical Interval Note:  04/09/2013 10:57 AM  Irven Easterly  has presented today for surgery, with the diagnosis of Non-STEMI / Syncope.  The various methods of treatment have been discussed with the patient and family. After consideration of risks, benefits and other options for treatment, the patient has consented to  Procedure(s): LEFT HEART CATHETERIZATION WITH CORONARY ANGIOGRAM (N/A) & POSSIBLE PERCUTANEOUS CORONARY INTERVENTION as a surgical intervention .  The patient's history has been reviewed, patient examined, no change in status, stable for surgery.  I have reviewed the patient's chart and labs.  Questions were answered to the patient's satisfaction.     HARDING,DAVID W  Cath Lab Visit (complete for each Cath Lab visit)  Clinical Evaluation Leading to the Procedure:   ACS: yes  Non-ACS:    Anginal Classification: No Symptoms  Anti-ischemic medical therapy: No Therapy  Non-Invasive Test Results: No non-invasive testing performed  Prior CABG: No previous CABG

## 2013-04-09 NOTE — CV Procedure (Signed)
CARDIAC CATHETERIZATION REPORT  NAME:  David Hogan   MRN: [id number] DOB:  13-Jun-1946   ADMIT DATE: 04/08/2013 Procedure Date: 04/09/2013  INTERVENTIONAL CARDIOLOGIST: Marykay Lex, M.D., MS PRIMARY CARE PROVIDER: Ardelle Balls, MD PRIMARY CARDIOLOGIST: Marykay Lex, M.D., MS  PATIENT:  David Hogan is a 67 y.o. male with a strong PMH of CAD, HTN, HLD, Obesity & OSA-CPAP who presented with mild Troponin Elevation following an unwitnessed syncopal event.  ECG upon initial evaluation noted STachy with ST depressions.  Initial Troponin levels were elevated.  Telemetry this AM showed brief run of NSVT.   PRE-OPERATIVE DIAGNOSIS:    NSTEMI  SYNCOPE  NON-SUSTAINED VENTRICULAR TACHYCARDIA  PROCEDURES PERFORMED:    LEFT HEART CATHETERIZATION WITH CORONARY ANGIOGRAPHY  PROCEDURE:Consent:  Risks of procedure as well as the alternatives and risks of each were explained to the (patient/caregiver).  Consent for procedure obtained. Consent for signed by MD and patient with RN witness -- placed on chart.   PROCEDURE: The patient was brought to the 2nd Floor [facility] in the fasting state and prepped and draped in the usual sterile fashion for Right groin or radial access. A modified Allen's test with plethysmography was performed, revealing excellent Ulnar artery collateral flow.  Sterile technique was used including antiseptics, cap, gloves, gown, hand hygiene, mask and sheet.  Skin prep: Chlorhexidine.  Time Out: Verified patient identification, verified procedure, site/side was marked, verified correct patient position, special equipment/implants available, medications/allergies/relevent history reviewed, required imaging and test results available.  Performed  Access: Right Radial  Artery; 6 Fr Sheath --   Seldinger technique (Angiocath Micropuncture Kit)  Radial Cocktail - 10 mL IA, IV Heparin 5000 units  Diagnostic:  5 Fr TIG 4.0 catheter advanced  and  exchanged over long exchange safety J. wire   Left Coronary Artery Angiography:5 Fr TIG 4.0, 6 Fr XB LAD 3.5   Right Coronary Artery Angiography: JR 4, unsuccessful, but unable to pass No Torque catheter; EZ Rad Right  LV Hemodynamics (LV Gram): JR 4   TR Band:  1425 Hours, 14  mL air  MEDICATIONS:  Anesthesia:  Local Lidocaine to  ml  Sedation:  1  mg IV Versed, 25  mcg IV fentanyl ;   Omnipaque Contrast: 180  ml, several additional views were taken with the guide catheter and for the LAD/ramus visualization.    Anticoagulation:  IV Heparin 5000 Units   Anti-Platelet Agent:  ASA 81 mg  Hemodynamics:  Central Aortic / Mean Pressures: 135/82 mmHg; 102 mmHg  Left Ventricular Pressures / EDP: 138/12 mmHg; 25 mmHg  Left Ventriculography: Not done - Echo just performed.  Coronary Anatomy:  Left Main: Very large caliber vessel that trifurcates into the LAD, Ramus Intermedius, and codominant Circumflex; there is mild distal narrowing, but otherwise angiographically normal.   LAD: Large-caliber vessel with focal ostial 80% followed by 70% proximal stenosis.  The vessel then gives off a septal perforator and a first diagonal branch.  It then wraps down around the apex with no further significant disease.    D1: Very proximal branch.  It bifurcates into larger and smaller branches.  The larger branches a moderate caliber vessel with a 60-70% lesion just at the bifurcation.    Left Circumflex: Very large caliber, codominant vessel.  Gives rise to several small obtuse marginal branches before it bifurcates into a large lateral obtuse marginal 1 in the left posterior lateral system with a small AV groove continuation.  Minimal to no angiographic evidence  of CAD in the entire circumflex system.    OM1: This is a large lateral branch that bifurcates distally around the inferolateral border.    Left Posterior Lateral System: This followed branch of the circumflex is as large as a lateral OM.   It gives rise to several small posterior lateral branches, at least 3.    Ramus intermedius: Moderate to large caliber vessel that bifurcates in the mid vessel covering a large portion of the anterolateral wall which would be the first obtuse marginal location.  There is ostial and proximal 99% stenosis.   RCA: Large caliber, codominant vessel with a downward takeoff.  There is mild ostial 20% stenosis.  The vessel terminates as a large posterior descending artery became the apex.  There is a diminutive  Right Posterior AV Groove Branch (RPAV).  Angiographically normal.     PATIENT DISPOSITION:    The patient was transferred to the PACU holding area in a hemodynamicaly stable, chest pain free condition.  The patient tolerated the procedure well, and there were no complications.  EBL:   < 10  ml  The patient was stable before, during, and after the procedure.  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  Moderately elevated LVEDP  PLAN OF CARE:  CT Surgery Consult -- Dr. Tyrone Sage to see  Add statin & BB (Carvedilol given NSVT on Tele)  Based on what could be potentially an unstable presentation with syncope with V. Tach they cannot be excluded as potential etiology, it would be more appropriate the patient maintained in the hospital for monitoring and not discharged home for outpatient CABG.  Restart IV heparin.     Marykay Lex, M.D., M.S. [facility] [address] [place], [place]  [address]  [phone]  04/09/2013 2:40 PM

## 2013-04-09 NOTE — Consult Note (Signed)
[address]       [place] [address]             [phone]        [facility] Medical Record #[id number] Date of Birth: 03/18/1946  Referring: David Lemma MD Primary Care: David Balls, MD  Chief Complaint:   Syncope after multiple Hogan stings    History of Present Illness:      This is a 67 year old obese Caucasian male who was in his usual state of health until the afternoon of 04/08/2013.  He was over at his sister's house helping his sister move. He picked up a container that was filled with yellow jackets and  got stung multiple times. He then ran into the house. He became diaphoretic and had some shortness of breath. He went and sat down. He got something to drink and then felt nauseated. His family noticed he was not responsive so they placed him on the floor. Apparently, his face was red, he was breathing, and had a pulse. EMS was called.The next thing he knew, he was lying on the floor with EMS there.  He denied any palpitations or chest pain. He also denied any difficulty swallowing or itching.He was tachycardic at 140bpm with ST depression in the anterolateral precordial leads. The ST depression resolved when the tachycardia resolved. He was only given Zofran for nausea and benadryl and solumedrol.  He denies any chest pain in the past but does have dyspnea on exertion, which he attributes to his obesity. Peak Troponin I was 0.56. He ruled in for a NSTEMI. Cardiac catheterization done today by Dr. Herbie Hogan revealed a focal ostial 80% followed by 70% proximal stenosis of the LAD, 60-70% diagonal, 99% stenosis of the ramus intermediate. Echocardiogram has been done. A cardiothoracic surgery consultation was requested forconsideration of coronary artery bypass grafting surgery.He was put on a heparin drip. He is currently chest pain free and is no acute distress.   Current Activity/ Functional Status: Patient is independent with mobility/ambulation,  transfers, ADL's, IADL's.   Zubrod Score: At the time of surgery this patient's most appropriate activity status/level should be described as: []  Normal activity, no symptoms [x]  Symptoms, fully ambulatory []  Symptoms, in bed less than or equal to 50% of the time []  Symptoms, in bed greater than 50% of the time but less than 100% []  Bedridden []  Moribund  Past Medical History  Diagnosis Date  . Asthma   . Complication of anesthesia     "woke up wild in recovery after gallbladder OR" (04/08/2013)  . Hypertension   . Heart murmur   . Exertional shortness of breath   . OSA on CPAP     Past Surgical History  Procedure Laterality Date  . Shoulder open rotator cuff repair Bilateral 1990's-2000's  . Incision and drainage of wound  ~ 1996    "hit elbow at work; it swelled up; had it opened up to get pus out" (04/08/2013)  . Cholecystectomy  ~ 2010  . Hernia repair  ~ 2012    "from the gallbladder OR" (04/08/2013)  . Carpal tunnel release Left 1990's    History  Smoking status  . Never Smoker   Smokeless tobacco  . Never Used    History  Alcohol Use  . Yes    Comment: 04/09/2013 "last drink was in 1978 or so; never had a problem w/it"    History   Social History  . Marital  Status: Married    Spouse Name: N/A    Number of Children: N/A  . Years of Education: N/A   Occupational History  . Retired   Social History Main Topics  . Smoking status: Never Smoker   . Smokeless tobacco: Never Used  . Alcohol Use: Yes     Comment: 04/09/2013 "last drink was in 1978 or so; never had a problem w/it"  . Drug Use: No  . Sexual Activity: Yes   Family History: Mother died in her 38's. She had an MI Father died at age 77. He had coronary artery disease and had a CABG 2 younger brothers are alive David Hogan has had an MI and had a stent; David Hogan has had an MI and had a CABG)   Allergies: No Known Allergies  Current Facility-Administered Medications  Medication Dose Route Frequency  Provider Last Rate Last Dose  . 0.9 %  sodium chloride infusion   Intravenous Continuous David Givens, MD 100 mL/hr at 04/08/13 2337    . 0.9 %  sodium chloride infusion  250 mL Intravenous PRN David Reichert, MD      . 0.9 %  sodium chloride infusion  1 mL/kg/hr Intravenous Continuous David Lex, MD      . acetaminophen (TYLENOL) tablet 650 mg  650 mg Oral Q4H PRN David Reichert, MD      . albuterol (PROVENTIL HFA;VENTOLIN HFA) 108 (90 BASE) MCG/ACT inhaler 2 puff  2 puff Inhalation Q6H PRN David Reichert, MD      . aspirin EC tablet 81 mg  81 mg Oral Daily David Reichert, MD   81 mg at 04/09/13 1241  . fenofibrate tablet 160 mg  160 mg Oral Daily David Reichert, MD      . fluticasone (FLONASE) 50 MCG/ACT nasal spray 2 spray  2 spray Each Nare Daily David Reichert, MD      . heparin ADULT infusion 100 units/mL (25000 units/250 mL)  1,850 Units/hr Intravenous Continuous David Bihari, MD   1,850 Units/hr at 04/09/13 [phone]  . montelukast (SINGULAIR) tablet 10 mg  10 mg Oral QHS David Reichert, MD      . nitroGLYCERIN (NITROSTAT) SL tablet 0.4 mg  0.4 mg Sublingual Q5 Min x 3 PRN David Reichert, MD      . ondansetron (ZOFRAN) injection 4 mg  4 mg Intravenous Q6H PRN David Reichert, MD      . sodium chloride 0.9 % injection 3 mL  3 mL Intravenous Q12H David R Turner, MD      . sodium chloride 0.9 % injection 3 mL  3 mL Intravenous PRN David Reichert, MD        Prescriptions prior to admission  Medication Sig Dispense Refill  . albuterol (PROVENTIL HFA;VENTOLIN HFA) 108 (90 BASE) MCG/ACT inhaler Inhale 2 puffs into the lungs every 6 (six) hours as needed for wheezing or shortness of breath.      [name] albuterol (PROVENTIL) (2.5 MG/3ML) 0.083% nebulizer solution Take 2.5 mg by nebulization every 6 (six) hours as needed for wheezing or shortness of breath.      . fenofibrate 160 MG tablet Take 160 mg by mouth daily.      . fluticasone (FLONASE) 50 MCG/ACT nasal spray Place 2 sprays into the nose  daily.      . hydrochlorothiazide (HYDRODIURIL) 25 MG tablet Take 25 mg by mouth daily.      . montelukast (SINGULAIR) 10 MG  tablet Take 10 mg by mouth every morning.          Review of Systems:     Cardiac Review of Systems: Y or N  Chest Pain [  n  ]  Resting SOB [  n ] Exertional SOB  [ y ]  Orthopnea [ n ]   Pedal Edema [  n ]    Palpitations [ n ] Syncope  Cove.Etienne  ]   Presyncope [  n ]  General Review of Systems: [Y] = yes [  ]=no Constitional: recent weight change [n  ]; anorexia [ n ]; fatigue [  n]; nausea [  y]; night sweats [  n]; fever [ n ]; or chills [  n]                                                                Eye : blurred vision[  n]; diplopia [ n  ]; vision changes [n  ];  Amaurosis fugax[ n ]; Resp: cough [ n ];  wheezing[n  ];  hemoptysis[ n ];  paroxysmal nocturnal dyspnea[ n ] GI:   vomiting[ n ];  dysphagia[ n ]; melena[  n];  hematochezia [n  ]; heartburn[n  ];   Hx of   GU: kidney stones [n  ]; hematuria[n  ];   dysuria [ n ];  nocturia[ n ];  history of  obstruction [n  ]; urinary frequency [ n ]             Skin: rash, swelling[ n ];, hair loss[ n ];  peripheral edema[n  ];  or itching[n  ]; Musculosketetal: myalgias[ n ];  joint swelling[n  ];  joint erythema[n  ];  joint pain[ n ];  back pain[ n ];  Heme/Lymph: bruising[ n ];  bleeding[ n ];  anemia[ n ];  Neuro: TIA[ n ];  headaches[n  ];  stroke[ n ];  vertigo[n  ];  seizures[ n ];   paresthesias[n  ];  difficulty walking[ n ];  Psych:depression[n  ]; anxiety[n  ];  Endocrine: diabetes[n  ];  thyroid dysfunction[ n ];    Physical Exam: BP 125/66  Pulse 108  Temp(Src) 98.2 F (36.8 C) (Oral)  Resp 20  Ht 5\' 10"  (1.778 m)  Wt 120.566 kg (265 lb 12.8 oz)  BMI 38.14 kg/m2  SpO2 97%  General appearance: alert, cooperative and no distress Neurologic: intact Heart: RRR, grade II/VI systolic murmur heard along left sternal border, no gallop Lungs: clear to auscultation bilaterally Abdomen: soft,  non-tender; bowel sounds normal; no masses,  no organomegaly Extremities: no cyanosis, clubbing, or edema. DP/PT palpable bilaterally HEENT: Head is normocephalic; Eyes-EOMI and PERRLA and sclera are non icteric; Neck is supple and no JVD, no carotid bruits   Diagnostic Studies & Laboratory data:     Recent Radiology Findings:   Ct Head Wo Contrast  04/08/2013   *RADIOLOGY REPORT*  Clinical Data: Fall with loss of consciousness.  CT HEAD WITHOUT CONTRAST  Technique:  Contiguous axial images were obtained from the base of the skull through the vertex without contrast.  Comparison: None.  Findings: Bone windows demonstrate no significant soft tissue swelling.  No skull fracture.  Clear paranasal sinuses and mastoid air cells.  Soft tissue windows demonstrate no  mass lesion, hemorrhage, hydrocephalus, acute infarct, intra-axial, or extra-axial fluid collection.  IMPRESSION:  No acute intracranial abnormality.   Original Report Authenticated By: Jeronimo Greaves, M.D.   Dg Chest Portable 1 View  04/08/2013   *RADIOLOGY REPORT*  Clinical Data: Syncope  PORTABLE CHEST - 1 VIEW  Comparison: None  Findings: Heart size upper normal.  Vascularity is normal.  Lungs are free of infiltrate effusion or mass.  IMPRESSION: No active cardiopulmonary abnormality.   Original Report Authenticated By: Janeece Riggers, M.D.      Recent Lab Findings: Lab Results  Component Value Date   WBC 19.2* 04/09/2013   HGB 14.0 04/09/2013   HCT 37.8* 04/09/2013   PLT 169 04/09/2013   GLUCOSE 202* 04/09/2013   ALT 52 04/09/2013   AST 46* 04/09/2013   NA 144 04/09/2013   K 3.6 04/09/2013   CL 107 04/09/2013   CREATININE 1.32 04/09/2013   BUN 18 04/09/2013   CO2 21 04/09/2013   INR 1.26 04/09/2013   ECHO: Study Conclusions  - Left ventricle: The cavity size was normal. There was severe concentric hypertrophy. Systolic function was vigorous. The estimated ejection fraction was in the range of 65% to 70%. Wall motion was normal; there were no regional  wall motion abnormalities. Doppler parameters are consistent with abnormal left ventricular relaxation (grade 1 diastolic dysfunction). The E/e' ratio is <10, suggesting normal LV filling pressure. - Aortic valve: Sclerosis without stenosis. Transvalvular velocity was minimally increased, owing to an increased LVOT velocity of 10 mmHg. No regurgitation. - Inferior vena cava: The vessel was normal in size; the respirophasic diameter changes were in the normal range (= 50%); findings are consistent with normal central venous pressure. Transthoracic echocardiography. M-mode, complete 2D, spectral Doppler, and color Doppler. Height: Height: 177.8cm. Height: 70in. Weight: Weight: 120.2kg. Weight: 264.4lb. Body mass index: BMI: 38kg/m^2. Body surface area: BSA: 2.45m^2. Blood pressure: 125/66. Patient status: Inpatient. Location: Bedside.  ------------------------------------------------------------  ------------------------------------------------------------ Left ventricle: The cavity size was normal. There was severe concentric hypertrophy. Systolic function was vigorous. The estimated ejection fraction was in the range of 65% to 70%. Wall motion was normal; there were no regional wall motion abnormalities. Doppler parameters are consistent with abnormal left ventricular relaxation (grade 1 diastolic dysfunction). The E/e' ratio is <10, suggesting normal LV filling pressure.  ------------------------------------------------------------ Aortic valve: Sclerosis without stenosis. Doppler: Transvalvular velocity was minimally increased, owing to an increased LVOT velocity of 10 mmHg. No regurgitation. VTI ratio of LVOT to aortic valve: 0.79. Indexed valve area: 1.4cm^2/m^2 (VTI). Peak velocity ratio of LVOT to aortic valve: 0.69. Indexed valve area: 1.21cm^2/m^2 (Vmax). Mean gradient: 11mm Hg (S). Peak gradient: 22mm Hg  (S).  ------------------------------------------------------------ Aorta: Aortic root: The aortic root was normal in size. Ascending aorta: The ascending aorta was normal in size.  ------------------------------------------------------------ Left atrium: The atrium was at the upper limits of normal in size.  ------------------------------------------------------------ Atrial septum: No defect or patent foramen ovale was identified.  ------------------------------------------------------------ Right ventricle: The cavity size was normal. Wall thickness was normal. Systolic function was normal.  ------------------------------------------------------------ Pulmonic valve: The valve appears to be grossly normal. Doppler: Trivial regurgitation.  ------------------------------------------------------------ Tricuspid valve: Poorly visualized. Doppler: Trivial regurgitation.  ------------------------------------------------------------ Pulmonary artery: Poorly visualized.  ------------------------------------------------------------ Right atrium: The atrium was normal in size.  ------------------------------------------------------------ Pericardium: There was no pericardial effusion.  ------------------------------------------------------------ Systemic veins: Inferior vena cava: The vessel was normal in size; the respirophasic diameter changes were in the normal range (= 50%);  findings are consistent with normal central venous pressure.  CATH: PATIENT: David Hogan is a 67 y.o. male with a strong PMH of CAD, HTN, HLD, Obesity & OSA-CPAP who presented with mild Troponin Elevation following an unwitnessed syncopal event. ECG upon initial evaluation noted STachy with ST depressions. Initial Troponin levels were elevated. Telemetry this AM showed brief run of NSVT.  PRE-OPERATIVE DIAGNOSIS:  NSTEMI  SYNCOPE  NON-SUSTAINED VENTRICULAR TACHYCARDIA PROCEDURES PERFORMED:  LEFT  HEART CATHETERIZATION WITH CORONARY ANGIOGRAPHY PROCEDURE:Consent: Risks of procedure as well as the alternatives and risks of each were explained to the (patient/caregiver). Consent for procedure obtained.  Consent for signed by MD and patient with RN witness -- placed on chart.  PROCEDURE: The patient was brought to the 2nd Floor [facility] in the fasting state and prepped and draped in the usual sterile fashion for Right groin or radial access. A modified Allen's test with plethysmography was performed, revealing excellent Ulnar artery collateral flow. Sterile technique was used including antiseptics, cap, gloves, gown, hand hygiene, mask and sheet. Skin prep: Chlorhexidine.  Time Out: Verified patient identification, verified procedure, site/side was marked, verified correct patient position, special equipment/implants available, medications/allergies/relevent history reviewed, required imaging and test results available. Performed  Access: Right Radial Artery; 6 Fr Sheath -- Seldinger technique (Angiocath Micropuncture Kit)  Radial Cocktail - 10 mL IA, IV Heparin 5000 units  Diagnostic: 5 Fr TIG 4.0 catheter advanced and exchanged over long exchange safety J. wire  Left Coronary Artery Angiography:5 Fr TIG 4.0, 6 Fr XB LAD 3.5  Right Coronary Artery Angiography: JR 4, unsuccessful, but unable to pass No Torque catheter; EZ Rad Right  LV Hemodynamics (LV Gram): JR 4  TR Band: 1425 Hours, 14 mL air  MEDICATIONS:  Anesthesia: Local Lidocaine to ml Sedation: 1 mg IV Versed, 25 mcg IV fentanyl ;  Omnipaque Contrast: 180 ml, several additional views were taken with the guide catheter and for the LAD/ramus visualization.  Anticoagulation: IV Heparin 5000 Units  Anti-Platelet Agent: ASA 81 mg Hemodynamics:  Central Aortic / Mean Pressures: 135/82 mmHg; 102 mmHg  Left Ventricular Pressures / EDP: 138/12 mmHg; 25 mmHg Left Ventriculography: Not done - Echo just performed.   Coronary Anatomy:  Left Main: Very large caliber vessel that trifurcates into the LAD, Ramus Intermedius, and codominant Circumflex; there is mild distal narrowing, but otherwise angiographically normal.  LAD: Large-caliber vessel with focal ostial 80% followed by 70% proximal stenosis. The vessel then gives off a septal perforator and a first diagonal branch. It then wraps down around the apex with no further significant disease.  D1: Very proximal branch. It bifurcates into larger and smaller branches. The larger branches a moderate caliber vessel with a 60-70% lesion just at the bifurcation.  Left Circumflex: Very large caliber, codominant vessel. Gives rise to several small obtuse marginal branches before it bifurcates into a large lateral obtuse marginal 1 in the left posterior lateral system with a small AV groove continuation. Minimal to no angiographic evidence of CAD in the entire circumflex system.  OM1: This is a large lateral branch that bifurcates distally around the inferolateral border.  Left Posterior Lateral System: This followed branch of the circumflex is as large as a lateral OM. It gives rise to several small posterior lateral branches, at least 3.  Ramus intermedius: Moderate to large caliber vessel that bifurcates in the mid vessel covering a large portion of the anterolateral wall which would be the first obtuse marginal location. There is  ostial and proximal 99% stenosis.  RCA: Large caliber, codominant vessel with a downward takeoff. There is mild ostial 20% stenosis. The vessel terminates as a large posterior descending artery became the apex. There is a diminutive Right Posterior AV Groove Branch (RPAV). Angiographically normal.  PATIENT DISPOSITION:  The patient was transferred to the PACU holding area in a hemodynamicaly stable, chest pain free condition.  The patient tolerated the procedure well, and there were no complications. EBL: < 10 ml  The patient was stable  before, during, and after the procedure. POST-OPERATIVE DIAGNOSIS:  Severe 2 Vessel CAD with Ostial LAD & Ostial Ramus stenoses not PCI amenable.  Moderately elevated LVEDP PLAN OF CARE:  CT Surgery Consult -- Dr. Tyrone Sage to see  Add statin & BB (Carvedilol given NSVT on Tele)  Based on what could be potentially an unstable presentation with syncope with V. Tach they cannot be excluded as potential etiology, it would be more appropriate the patient maintained in the hospital for monitoring and not discharged home for outpatient CABG.  Restart IV heparin.  David Hogan, M.D., M.S.  [facility]  [address]  [place], [place] [address]  [phone]   Assessment / Plan:      1.NSTEMI after stress of multiple yellow jacket stings 2.CAD with high grade prox lad disease not suitable  For  stent/angioplasty-will need CABG,  Friday.  3.Syncopal episode 4.NSVT   I have recommended proceeding with CABG  The goals risks and alternatives of the planned surgical procedure CABG have been discussed with the patient in detail. The risks of the procedure including death, infection, stroke, myocardial infarction, bleeding, blood transfusion have all been discussed specifically.  I have quoted David Hogan a 3 % of perioperative mortality and a complication rate as high as  20%. The patient's questions have been answered.David Hogan is willing  to proceed with the planned procedure.   Delight Ovens MD      [address] [place] [place] [address] Office [phone]   Beeper [phone]  04/09/2013 5:30 pm

## 2013-04-09 NOTE — Progress Notes (Signed)
Utilization review completed.  

## 2013-04-09 NOTE — Progress Notes (Signed)
ANTICOAGULATION CONSULT NOTE   Pharmacy Consult for heparin  Indication: chest pain/ACS  No Known Allergies  Labs:  Recent Labs  04/08/13 1558 04/08/13 1746 04/09/13 0009 04/09/13 0600 04/09/13 0618  HGB 14.1  --  14.2  --  14.0  HCT 40.1  --  39.7  --  37.8*  PLT 159  --  165  --  169  HEPARINUNFRC  --   --   --  0.28*  --   CREATININE 1.30 1.26 1.32  --   --   CKTOTAL 371*  --   --   --   --   TROPONINI 0.39* 0.56* 0.47*  --  <0.30    Estimated Creatinine Clearance: 72.6 ml/min (by C-G formula based on Cr of 1.32).  Assessment: 66 yo obese male with asthma was stung by multiple yellow jackkets while working outdoors. With sycopal episode-passsed out and tachy in the 140's with st depression troponin elevated. Heparin until r/o. No anticoags PTA.  Heparin level = 0.28 this AM  Goal of Therapy:  Heparin level 0.3-0.7 units/ml Monitor platelets by anticoagulation protocol: Yes   Plan:  1) Increase heparin to 1850 units / hr 2) Follow up AM heparin level, CBC  Thank you. Okey Regal, PharmD [phone]  04/09/2013,8:46 AM

## 2013-04-09 NOTE — Progress Notes (Deleted)
7:04 AM       HL slightly below goal at 0.28 with current rate of 1700 units/hr. No bleeding noted. Increasing to 1850 and recheck in 8 hours.   Janice Coffin

## 2013-04-09 NOTE — Progress Notes (Addendum)
Pharmacy Consult - Heparin  Resuming heparin 8 hours after sheath removal CVTS consult pending  Plan: 1) Heparin to begin at 2300 pm at 1850 units / hr 2) Follow up AM labs  Thank you. Okey Regal, PharmD [phone]  Addendum:  RN just called and said that the TR band was removed ~1815. So heparin should start 8 hrs post band removal.  Plan:  1) Will restart heparin 1850 units/hr at 0415 (8 hrs post TR band removal) 2) Heparin level 6 hours post restart 3) Daily HL/CBC  Christoper Fabian, PharmD, BCPS Clinical pharmacist, pager [phone] 04/09/2013  10:50 PM

## 2013-04-10 ENCOUNTER — Inpatient Hospital Stay (HOSPITAL_COMMUNITY): Payer: Medicare Other

## 2013-04-10 DIAGNOSIS — I214 Non-ST elevation (NSTEMI) myocardial infarction: Secondary | ICD-10-CM

## 2013-04-10 DIAGNOSIS — I472 Ventricular tachycardia, unspecified: Secondary | ICD-10-CM

## 2013-04-10 DIAGNOSIS — R55 Syncope and collapse: Secondary | ICD-10-CM

## 2013-04-10 LAB — COMPREHENSIVE METABOLIC PANEL
ALT: 42 U/L (ref 0–53)
AST: 40 U/L — ABNORMAL HIGH (ref 0–37)
Albumin: 3.9 g/dL (ref 3.5–5.2)
Alkaline Phosphatase: 39 U/L (ref 39–117)
BUN: 16 mg/dL (ref 6–23)
CO2: 26 mEq/L (ref 19–32)
Calcium: 8.7 mg/dL (ref 8.4–10.5)
Chloride: 104 mEq/L (ref 96–112)
Creatinine, Ser: 1.04 mg/dL (ref 0.50–1.35)
GFR calc Af Amer: 85 mL/min — ABNORMAL LOW (ref >90)
GFR calc non Af Amer: 73 mL/min — ABNORMAL LOW (ref >90)
Glucose, Bld: 98 mg/dL (ref 70–99)
Potassium: 3.4 mEq/L — ABNORMAL LOW (ref 3.5–5.1)
Sodium: 140 mEq/L (ref 135–145)
Total Bilirubin: 0.7 mg/dL (ref 0.3–1.2)
Total Protein: 6.9 g/dL (ref 6.0–8.3)

## 2013-04-10 LAB — CBC
HCT: 39.2 % (ref 39.0–52.0)
Hemoglobin: 13.9 g/dL (ref 13.0–17.0)
Hemoglobin: 14.2 g/dL (ref 13.0–17.0)
MCH: 31.6 pg (ref 26.0–34.0)
MCH: 31.5 pg (ref 26.0–34.0)
MCHC: 35.8 g/dL (ref 30.0–36.0)
MCHC: 36.2 g/dL — ABNORMAL HIGH (ref 30.0–36.0)
MCV: 86.9 fL (ref 78.0–100.0)
Platelets: 173 10*3/uL (ref 150–400)
Platelets: 169 10*3/uL (ref 150–400)
RBC: 4.51 MIL/uL (ref 4.22–5.81)
RDW: 13.1 % (ref 11.5–15.5)
WBC: 12.4 10*3/uL — ABNORMAL HIGH (ref 4.0–10.5)

## 2013-04-10 LAB — BLOOD GAS, ARTERIAL
Acid-Base Excess: 2 mmol/L (ref 0.0–2.0)
Bicarbonate: 25.7 mEq/L — ABNORMAL HIGH (ref 20.0–24.0)
Drawn by: 24610
O2 Saturation: 97.7 %
Patient temperature: 98.6
TCO2: 26.9 mmol/L (ref 0–100)
pCO2 arterial: 38.4 mmHg (ref 35.0–45.0)
pH, Arterial: 7.441 (ref 7.350–7.450)
pO2, Arterial: 79.2 mmHg — ABNORMAL LOW (ref 80.0–100.0)

## 2013-04-10 LAB — LIPID PANEL
Cholesterol: 152 mg/dL (ref 0–200)
HDL: 34 mg/dL — ABNORMAL LOW (ref >39)
LDL Cholesterol: 93 mg/dL (ref 0–99)
Total CHOL/HDL Ratio: 4.5 RATIO
Triglycerides: 125 mg/dL (ref <150)
VLDL: 25 mg/dL (ref 0–40)

## 2013-04-10 LAB — HEPARIN LEVEL (UNFRACTIONATED): Heparin Unfractionated: 0.37 IU/mL (ref 0.30–0.70)

## 2013-04-10 LAB — HEMOGLOBIN A1C
Hgb A1c MFr Bld: 5.4 % (ref <5.7)
Mean Plasma Glucose: 108 mg/dL (ref <117)

## 2013-04-10 LAB — PROTIME-INR
INR: 1.25 (ref 0.00–1.49)
Prothrombin Time: 15.4 seconds — ABNORMAL HIGH (ref 11.6–15.2)

## 2013-04-10 LAB — TYPE AND SCREEN
ABO/RH(D): A POS
Antibody Screen: NEGATIVE

## 2013-04-10 LAB — APTT: aPTT: 65 seconds — ABNORMAL HIGH (ref 24–37)

## 2013-04-10 MED ORDER — POTASSIUM CHLORIDE 2 MEQ/ML IV SOLN
80.0000 meq | INTRAVENOUS | Status: DC
  Filled 2013-04-10: qty 40

## 2013-04-10 MED ORDER — EPINEPHRINE HCL 1 MG/ML IJ SOLN
0.5000 ug/min | INTRAMUSCULAR | Status: DC
  Filled 2013-04-10: qty 4

## 2013-04-10 MED ORDER — SODIUM CHLORIDE 0.9 % IV SOLN
INTRAVENOUS | Status: DC
  Filled 2013-04-10: qty 30

## 2013-04-10 MED ORDER — SODIUM CHLORIDE 0.9 % IV SOLN
INTRAVENOUS | Status: DC
  Filled 2013-04-10: qty 40

## 2013-04-10 MED ORDER — NITROGLYCERIN IN D5W 200-5 MCG/ML-% IV SOLN
2.0000 ug/min | INTRAVENOUS | Status: DC
  Filled 2013-04-10: qty 250

## 2013-04-10 MED ORDER — PAPAVERINE HCL 30 MG/ML IJ SOLN
INTRAMUSCULAR | Status: AC
  Administered 2013-04-11: 09:00:00
  Filled 2013-04-10: qty 2.5

## 2013-04-10 MED ORDER — MUPIROCIN 2 % EX OINT
TOPICAL_OINTMENT | Freq: Two times a day (BID) | CUTANEOUS | Status: DC
  Administered 2013-04-10 (×2): via NASAL
  Filled 2013-04-10: qty 22

## 2013-04-10 MED ORDER — TEMAZEPAM 15 MG PO CAPS: 15.0000 mg | ORAL_CAPSULE | Freq: Once | ORAL | Status: AC | PRN

## 2013-04-10 MED ORDER — CHLORHEXIDINE GLUCONATE 4 % EX LIQD
60.0000 mL | Freq: Once | CUTANEOUS | Status: AC
Start: 2013-04-10 — End: 2013-04-10
  Administered 2013-04-10: 4 via TOPICAL
  Filled 2013-04-10: qty 60

## 2013-04-10 MED ORDER — METOPROLOL TARTRATE 12.5 MG HALF TABLET
12.5000 mg | ORAL_TABLET | Freq: Once | ORAL | Status: DC
  Filled 2013-04-10: qty 1

## 2013-04-10 MED ORDER — MAGNESIUM SULFATE 50 % IJ SOLN
40.0000 meq | INTRAMUSCULAR | Status: DC
  Filled 2013-04-10: qty 10

## 2013-04-10 MED ORDER — DEXTROSE 5 % IV SOLN
1.5000 g | INTRAVENOUS | Status: AC
  Administered 2013-04-11: 1.5 g via INTRAVENOUS
  Administered 2013-04-11: .75 g via INTRAVENOUS
  Filled 2013-04-10: qty 1.5

## 2013-04-10 MED ORDER — DEXMEDETOMIDINE HCL IN NACL 400 MCG/100ML IV SOLN
0.1000 ug/kg/h | INTRAVENOUS | Status: DC
  Filled 2013-04-10: qty 100

## 2013-04-10 MED ORDER — DEXTROSE 5 % IV SOLN
30.0000 ug/min | INTRAVENOUS | Status: DC
  Filled 2013-04-10: qty 2

## 2013-04-10 MED ORDER — HEPARIN (PORCINE) IN NACL 100-0.45 UNIT/ML-% IJ SOLN
1850.0000 [IU]/h | INTRAMUSCULAR | Status: DC
  Administered 2013-04-10 (×2): 1850 [IU]/h via INTRAVENOUS
  Filled 2013-04-10 (×4): qty 250

## 2013-04-10 MED ORDER — BISACODYL 5 MG PO TBEC
5.0000 mg | DELAYED_RELEASE_TABLET | Freq: Once | ORAL | Status: AC
  Administered 2013-04-10: 5 mg via ORAL
  Filled 2013-04-10: qty 1

## 2013-04-10 MED ORDER — SODIUM CHLORIDE 0.9 % IV SOLN
INTRAVENOUS | Status: DC
  Filled 2013-04-10: qty 1

## 2013-04-10 MED ORDER — ALBUTEROL SULFATE (5 MG/ML) 0.5% IN NEBU
2.5000 mg | INHALATION_SOLUTION | Freq: Once | RESPIRATORY_TRACT | Status: AC
  Administered 2013-04-10: 2.5 mg via RESPIRATORY_TRACT

## 2013-04-10 MED ORDER — DOPAMINE-DEXTROSE 3.2-5 MG/ML-% IV SOLN
2.0000 ug/kg/min | INTRAVENOUS | Status: DC
  Filled 2013-04-10: qty 250

## 2013-04-10 MED ORDER — DEXTROSE 5 % IV SOLN
750.0000 mg | INTRAVENOUS | Status: DC
  Filled 2013-04-10 (×2): qty 750

## 2013-04-10 MED ORDER — SODIUM CHLORIDE 0.9 % IV SOLN
1250.0000 mg | INTRAVENOUS | Status: AC
  Administered 2013-04-11: 1250 mg via INTRAVENOUS
  Filled 2013-04-10: qty 1250

## 2013-04-10 MED ORDER — CHLORHEXIDINE GLUCONATE 4 % EX LIQD
60.0000 mL | Freq: Once | CUTANEOUS | Status: AC
  Administered 2013-04-11: 4 via TOPICAL
  Filled 2013-04-10: qty 60

## 2013-04-10 NOTE — Progress Notes (Signed)
ANTICOAGULATION CONSULT NOTE   Pharmacy Consult for heparin  Indication: chest pain/ACS  No Known Allergies  Labs:  Recent Labs  04/08/13 1558 04/08/13 1746 04/09/13 0009 04/09/13 0600 04/09/13 0618 04/09/13 1100 04/10/13 0504 04/10/13 1220  HGB 14.1  --  14.2  --  14.0  --  13.9 14.2  HCT 40.1  --  39.7  --  37.8*  --  38.8* 39.2  PLT 159  --  165  --  169  --  173 169  APTT  --   --   --   --   --   --   --  65*  LABPROT  --   --   --   --   --  15.5*  --  15.4*  INR  --   --   --   --   --  1.26  --  1.25  HEPARINUNFRC  --   --   --  0.28*  --   --   --  0.37  CREATININE 1.30 1.26 1.32  --   --   --   --   --   CKTOTAL 371*  --   --   --   --   --   --   --   TROPONINI 0.39* 0.56* 0.47*  --  <0.30 0.35*  --   --     Estimated Creatinine Clearance: 72.6 ml/min (by C-G formula based on Cr of 1.32).  Assessment: 67 yo obese male with asthma was stung by multiple yellow jackkets while working outdoors. With sycopal episode-passsed out and tachy in the 140's with st depression troponin elevated. .  Heparin level = 0.37 today  Goal of Therapy:  Heparin level 0.3-0.7 units/ml Monitor platelets by anticoagulation protocol: Yes   Plan:  1) Continue heparin at 1850 units / hr 2) Follow up after CABG tomorrow  Thank you. Okey Regal, PharmD [phone]  04/10/2013,1:08 PM

## 2013-04-10 NOTE — Progress Notes (Signed)
[address]       [place] [address]             [phone]                 1 Day Post-Op Procedure(s) (LRB): LEFT HEART CATHETERIZATION WITH CORONARY ANGIOGRAM (N/A)  LOS: 2 days   Subjective: No cp today  Objective: Vital signs in last 24 hours: Patient Vitals for the past 24 hrs:  BP Temp Pulse Resp SpO2  04/10/13 1503 152/104 mmHg 97.7 F (36.5 C) 91 21 99 %  04/10/13 0523 141/88 mmHg 97.6 F (36.4 C) 84 - 98 %  04/09/13 2020 126/60 mmHg 98 F (36.7 C) 91 - 96 %    Filed Weights   04/08/13 2329  Weight: 265 lb 12.8 oz (120.566 kg)    Hemodynamic parameters for last 24 hours:    Intake/Output from previous day: 09/03 0701 - 09/04 0700 In: 360 [P.O.:360] Out: -  Intake/Output this shift: Total I/O In: 360 [P.O.:360] Out: -   Scheduled Meds: . [START ON 04/11/2013] aminocaproic acid (AMICAR) for OHS   Intravenous To OR  . aspirin EC  81 mg Oral Daily  . atorvastatin  80 mg Oral q1800  . carvedilol  3.125 mg Oral BID WC  . [START ON 04/11/2013] cefUROXime (ZINACEF)  IV  1.5 g Intravenous To OR  . [START ON 04/11/2013] cefUROXime (ZINACEF)  IV  750 mg Intravenous To OR  . chlorhexidine  60 mL Topical Once  . [START ON 04/11/2013] chlorhexidine  60 mL Topical Once  . [START ON 04/11/2013] dexmedetomidine  0.1-0.7 mcg/kg/hr Intravenous To OR  . [START ON 04/11/2013] DOPamine  2-20 mcg/kg/min Intravenous To OR  . [START ON 04/11/2013] epinephrine  0.5-20 mcg/min Intravenous To OR  . fenofibrate  160 mg Oral Daily  . fluticasone  2 spray Each Nare Daily  . [START ON 04/11/2013] heparin-papaverine-plasmalyte irrigation   Irrigation To OR  . [START ON 04/11/2013] heparin 30,000 units/NS 1000 mL solution for CELLSAVER   Other To OR  . [START ON 04/11/2013] insulin (NOVOLIN-R) infusion   Intravenous To OR  . [START ON 04/11/2013] magnesium sulfate  40 mEq Other To OR  . montelukast  10 mg Oral QHS  . mupirocin ointment   Nasal BID  . [START ON 04/11/2013]  nitroGLYCERIN  2-200 mcg/min Intravenous To OR  . [START ON 04/11/2013] phenylephrine (NEO-SYNEPHRINE) Adult infusion  30-200 mcg/min Intravenous To OR  . [START ON 04/11/2013] potassium chloride  80 mEq Other To OR  . sodium chloride  3 mL Intravenous Q12H  . sodium chloride  3 mL Intravenous Q12H  . [START ON 04/11/2013] vancomycin  1,250 mg Intravenous To OR   Continuous Infusions: . sodium chloride 100 mL/hr at 04/08/13 2337  . heparin 1,850 Units/hr (04/10/13 1738)   PRN Meds:.sodium chloride, sodium chloride, acetaminophen, albuterol, morphine injection, nitroGLYCERIN, ondansetron (ZOFRAN) IV, sodium chloride, sodium chloride, temazepam  General appearance: alert, cooperative and appears older than stated age Neurologic: intact Heart: regular rate and rhythm, S1, S2 normal, no murmur, click, rub or gallop Lungs: clear to auscultation bilaterally Abdomen: soft, non-tender; bowel sounds normal; no masses,  no organomegaly Extremities: extremities normal, atraumatic, no cyanosis or edema, Homans sign is negative, no sign of DVT and "spider bite on rt forearm unchanged from yesterday mild erythymia .  Lab Results: CBC: Recent Labs  04/10/13 0504 04/10/13 1220  WBC 13.4* 12.4*  HGB 13.9 14.2  HCT 38.8* 39.2  PLT 173 169   BMET:  Recent Labs  04/09/13 0009 04/10/13 1220  NA 144 140  K 3.6 3.4*  CL 107 104  CO2 21 26  GLUCOSE 202* 98  BUN 18 16  CREATININE 1.32 1.04  CALCIUM 9.5 8.7    PT/INR:  Recent Labs  04/10/13 1220  LABPROT 15.4*  INR 1.25     Radiology Ct Head Wo Contrast  04/08/2013   *RADIOLOGY REPORT*  Clinical Data: Fall with loss of consciousness.  CT HEAD WITHOUT CONTRAST  Technique:  Contiguous axial images were obtained from the base of the skull through the vertex without contrast.  Comparison: None.  Findings: Bone windows demonstrate no significant soft tissue swelling.  No skull fracture.  Clear paranasal sinuses and mastoid air cells.  Soft tissue  windows demonstrate no  mass lesion, hemorrhage, hydrocephalus, acute infarct, intra-axial, or extra-axial fluid collection.  IMPRESSION:  No acute intracranial abnormality.   Original Report Authenticated By: Jeronimo Greaves, M.D.   Dg Chest Portable 1 View  04/08/2013   *RADIOLOGY REPORT*  Clinical Data: Syncope  PORTABLE CHEST - 1 VIEW  Comparison: None  Findings: Heart size upper normal.  Vascularity is normal.  Lungs are free of infiltrate effusion or mass.  IMPRESSION: No active cardiopulmonary abnormality.   Original Report Authenticated By: Janeece Riggers, M.D.     Assessment/Plan: S/P Procedure(s) (LRB): LEFT HEART CATHETERIZATION WITH CORONARY ANGIOGRAM (N/A) plan to proceed with cabg as discussed with patient yeaterday, He is agreeable   Delight Ovens MD 04/10/2013 6:19 PM

## 2013-04-10 NOTE — Progress Notes (Signed)
Pre-op Cardiac Surgery  Carotid Findings:  1-39% ICA stenosis.  Vertebral artery flow is antegrade.  Upper Extremity Right Left  Brachial Pressures 166T 166T  Radial Waveforms T T  Ulnar Waveforms T T  Palmar Arch (Allen's Test) WNL WNL   Findings:      Lower  Extremity Right Left  Dorsalis Pedis    Anterior Tibial    Posterior Tibial    Ankle/Brachial Indices      Findings:  Palpable

## 2013-04-10 NOTE — Progress Notes (Signed)
[id number] Discussed sternal precautions with pt and wife. Showed pt how to get up and down without using arms. Discussed importance of mobility after surgery. Understanding voiced. Asked RN to give pt IS as he has had PFTs but does not have one. Discussed importance of IS use. Gave OHS booklet and wrote how to view preop video. They are going to watch this evening. Will follow up after surgery. Cherri Yera DunlapRNBSN

## 2013-04-10 NOTE — Progress Notes (Signed)
[facility]  Subjective: No complaints. Denies CP.   Objective: Vital signs in last 24 hours: Temp:  [97.6 F (36.4 C)-98 F (36.7 C)] 97.6 F (36.4 C) (09/04 0523) Pulse Rate:  [84-108] 84 (09/04 0523) BP: (126-154)/(53-88) 141/88 mmHg (09/04 0523) SpO2:  [96 %-98 %] 98 % (09/04 0523) Last BM Date: 04/09/13  Intake/Output from previous day: 09/03 0701 - 09/04 0700 In: 360 [P.O.:360] Out: -  Intake/Output this shift:    Medications Current Facility-Administered Medications  Medication Dose Route Frequency Provider Last Rate Last Dose  . 0.9 %  sodium chloride infusion   Intravenous Continuous Ward Givens, MD 100 mL/hr at 04/08/13 2337    . 0.9 %  sodium chloride infusion  250 mL Intravenous PRN Quintella Reichert, MD      . 0.9 %  sodium chloride infusion  250 mL Intravenous PRN Marykay Lex, MD      . acetaminophen (TYLENOL) tablet 650 mg  650 mg Oral Q4H PRN Quintella Reichert, MD      . albuterol (PROVENTIL HFA;VENTOLIN HFA) 108 (90 BASE) MCG/ACT inhaler 2 puff  2 puff Inhalation Q6H PRN Quintella Reichert, MD      . aspirin EC tablet 81 mg  81 mg Oral Daily Quintella Reichert, MD   81 mg at 04/09/13 1241  . atorvastatin (LIPITOR) tablet 80 mg  80 mg Oral q1800 Marykay Lex, MD   80 mg at 04/09/13 1800  . bisacodyl (DULCOLAX) EC tablet 5 mg  5 mg Oral Once Delight Ovens, MD      . carvedilol (COREG) tablet 3.125 mg  3.125 mg Oral BID WC Marykay Lex, MD   3.125 mg at 04/09/13 1800  . chlorhexidine (HIBICLENS) 4 % liquid 4 application  60 mL Topical Once Delight Ovens, MD      . Melene Muller ON 04/11/2013] chlorhexidine (HIBICLENS) 4 % liquid 4 application  60 mL Topical Once Delight Ovens, MD      . fenofibrate tablet 160 mg  160 mg Oral Daily Quintella Reichert, MD   160 mg at 04/09/13 1800  . fluticasone (FLONASE) 50 MCG/ACT nasal spray 2 spray  2 spray Each Nare Daily Quintella Reichert, MD   2 spray at 04/09/13 1800  . heparin ADULT infusion 100  units/mL (25000 units/250 mL)  1,850 Units/hr Intravenous Continuous Delight Ovens, MD      . Melene Muller ON 04/11/2013] metoprolol tartrate (LOPRESSOR) tablet 12.5 mg  12.5 mg Oral Once Delight Ovens, MD      . montelukast (SINGULAIR) tablet 10 mg  10 mg Oral QHS Quintella Reichert, MD   10 mg at 04/09/13 2205  . morphine 2 MG/ML injection 2 mg  2 mg Intravenous Q1H PRN Marykay Lex, MD      . mupirocin ointment (BACTROBAN) 2 %   Nasal BID Delight Ovens, MD      . nitroGLYCERIN (NITROSTAT) SL tablet 0.4 mg  0.4 mg Sublingual Q5 Min x 3 PRN Quintella Reichert, MD      . ondansetron (ZOFRAN) injection 4 mg  4 mg Intravenous Q6H PRN Quintella Reichert, MD      . sodium chloride 0.9 % injection 3 mL  3 mL Intravenous Q12H Traci R Turner, MD      . sodium chloride 0.9 % injection 3 mL  3 mL Intravenous PRN Quintella Reichert, MD      .  sodium chloride 0.9 % injection 3 mL  3 mL Intravenous Q12H Marykay Lex, MD      . sodium chloride 0.9 % injection 3 mL  3 mL Intravenous PRN Marykay Lex, MD      . temazepam (RESTORIL) capsule 15 mg  15 mg Oral Once PRN Delight Ovens, MD        PE: General appearance: alert, cooperative, no distress and moderately obese Lungs: clear to auscultation bilaterally Heart: regular rate and rhythm and 2/6 SM  Extremities: trace bilateral LEE Pulses: 2+ and symmetric Skin: warm and dry' Neurologic: Grossly normal  Lab Results:   Recent Labs  04/09/13 0009 04/09/13 0618 04/10/13 0504  WBC 13.9* 19.2* 13.4*  HGB 14.2 14.0 13.9  HCT 39.7 37.8* 38.8*  PLT 165 169 173   BMET  Recent Labs  04/08/13 1558 04/08/13 1746 04/09/13 0009  NA 142 140 144  K 2.9* 3.4* 3.6  CL 105 104 107  CO2 27 24 21   GLUCOSE 109* 117* 202*  BUN 17 16 18   CREATININE 1.30 1.26 1.32  CALCIUM 9.5 9.2 9.5   PT/INR  Recent Labs  04/09/13 1100  LABPROT 15.5*  INR 1.26   Studies  LHC 04/09/13 POST-OPERATIVE DIAGNOSIS:  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  Moderately elevated LVEDP  Assessment/Plan  Principal Problem:   NSTEMI (non-ST elevated myocardial infarction) Active Problems:   Sinus tachycardia   Obesity, unspecified   NSVT (nonsustained ventricular tachycardia)   Syncope - UNSURE OF ETIOLOGY, and CANNOT EXCLUDE VTACH  Plan: S/p LHC yesterday revealing severe 2 vessel CAD w/ ostial LAD and ostial ramus stenoses, not amenable with PCI. Plan is for CABG with Dr. Tyrone Sage tomorrow. Keep on IV heparin. HR and BP stable. No further VT on telemetry.     LOS: 2 days    Brittainy M. Simmons, PA-C 04/10/2013 9:00 AM  Pt. Seen & examined along with Ms. Sharol Harness, [place].  I agree with her findings, exam & plan.  Very interesting presentation - Syncope after multiple bee/hornet stings & heat exhuastion --> + Troponin, VT on monitor yesterday --> taken for Dx cath tht shoed severe ostial LAD & Ramus disease with moderate D1 disease.  Seen by tDr. Tyrone Sage - CABG in AM.  Contineu IV Heparin.  BB (Coreg Started post-cath), statin added. -- BP/HR stable.  Marykay Lex, MD

## 2013-04-11 ENCOUNTER — Inpatient Hospital Stay (HOSPITAL_COMMUNITY): Payer: Medicare Other

## 2013-04-11 ENCOUNTER — Encounter (HOSPITAL_COMMUNITY)
Admission: EM | Disposition: A | Discharge status: Home / Self Care | Payer: Medicare Other | Source: Home / Self Care | Attending: Cardiothoracic Surgery

## 2013-04-11 ENCOUNTER — Encounter (HOSPITAL_COMMUNITY): Payer: Self-pay | Admitting: Anesthesiology

## 2013-04-11 ENCOUNTER — Inpatient Hospital Stay (HOSPITAL_COMMUNITY): Payer: Medicare Other | Admitting: Anesthesiology

## 2013-04-11 DIAGNOSIS — I2510 Atherosclerotic heart disease of native coronary artery without angina pectoris: Secondary | ICD-10-CM

## 2013-04-11 DIAGNOSIS — Z951 Presence of aortocoronary bypass graft: Secondary | ICD-10-CM

## 2013-04-11 HISTORY — PX: CORONARY ARTERY BYPASS GRAFT: SHX141

## 2013-04-11 HISTORY — PX: INTRAOPERATIVE TRANSESOPHAGEAL ECHOCARDIOGRAM: SHX5062

## 2013-04-11 HISTORY — DX: Presence of aortocoronary bypass graft: Z95.1

## 2013-04-11 LAB — POCT I-STAT 4, (NA,K, GLUC, HGB,HCT)
Glucose, Bld: 110 mg/dL — ABNORMAL HIGH (ref 70–99)
Glucose, Bld: 111 mg/dL — ABNORMAL HIGH (ref 70–99)
Glucose, Bld: 108 mg/dL — ABNORMAL HIGH (ref 70–99)
Glucose, Bld: 113 mg/dL — ABNORMAL HIGH (ref 70–99)
HCT: 39 % (ref 39.0–52.0)
HCT: 30 % — ABNORMAL LOW (ref 39.0–52.0)
HCT: 35 % — ABNORMAL LOW (ref 39.0–52.0)
HCT: 35 % — ABNORMAL LOW (ref 39.0–52.0)
Hemoglobin: 13.3 g/dL (ref 13.0–17.0)
Hemoglobin: 10.2 g/dL — ABNORMAL LOW (ref 13.0–17.0)
Potassium: 4 mEq/L (ref 3.5–5.1)
Potassium: 4.4 mEq/L (ref 3.5–5.1)
Potassium: 3.8 mEq/L (ref 3.5–5.1)
Sodium: 141 mEq/L (ref 135–145)
Sodium: 139 mEq/L (ref 135–145)
Sodium: 140 mEq/L (ref 135–145)

## 2013-04-11 LAB — CBC
HCT: 35.9 % — ABNORMAL LOW (ref 39.0–52.0)
Hemoglobin: 13.1 g/dL (ref 13.0–17.0)
MCH: 30.9 pg (ref 26.0–34.0)
MCH: 31.2 pg (ref 26.0–34.0)
MCH: 31.6 pg (ref 26.0–34.0)
MCHC: 35.8 g/dL (ref 30.0–36.0)
MCHC: 36.4 g/dL — ABNORMAL HIGH (ref 30.0–36.0)
MCHC: 36.5 g/dL — ABNORMAL HIGH (ref 30.0–36.0)
MCV: 86.4 fL (ref 78.0–100.0)
MCV: 85.9 fL (ref 78.0–100.0)
MCV: 86.7 fL (ref 78.0–100.0)
Platelets: 167 10*3/uL (ref 150–400)
Platelets: 104 10*3/uL — ABNORMAL LOW (ref 150–400)
Platelets: 132 10*3/uL — ABNORMAL LOW (ref 150–400)
RBC: 4.63 MIL/uL (ref 4.22–5.81)
RBC: 4.1 MIL/uL — ABNORMAL LOW (ref 4.22–5.81)
RBC: 4.14 MIL/uL — ABNORMAL LOW (ref 4.22–5.81)
RDW: 13.1 % (ref 11.5–15.5)
RDW: 12.9 % (ref 11.5–15.5)
RDW: 13.1 % (ref 11.5–15.5)
WBC: 12.1 10*3/uL — ABNORMAL HIGH (ref 4.0–10.5)

## 2013-04-11 LAB — POCT I-STAT, CHEM 8
Chloride: 106 mEq/L (ref 96–112)
Glucose, Bld: 111 mg/dL — ABNORMAL HIGH (ref 70–99)
HCT: 36 % — ABNORMAL LOW (ref 39.0–52.0)
Potassium: 4.5 mEq/L (ref 3.5–5.1)
Sodium: 141 mEq/L (ref 135–145)

## 2013-04-11 LAB — POCT I-STAT 3, ART BLOOD GAS (G3+)
Acid-Base Excess: 2 mmol/L (ref 0.0–2.0)
Bicarbonate: 26.1 mEq/L — ABNORMAL HIGH (ref 20.0–24.0)
Bicarbonate: 25.5 mEq/L — ABNORMAL HIGH (ref 20.0–24.0)
Bicarbonate: 23.4 mEq/L (ref 20.0–24.0)
Bicarbonate: 24.4 mEq/L — ABNORMAL HIGH (ref 20.0–24.0)
O2 Saturation: 76 %
O2 Saturation: 96 %
Patient temperature: 34
Patient temperature: 37
Patient temperature: 37.6
Patient temperature: 37.9
TCO2: 27 mmol/L (ref 0–100)
TCO2: 29 mmol/L (ref 0–100)
pCO2 arterial: 41.6 mmHg (ref 35.0–45.0)
pCO2 arterial: 51.2 mmHg — ABNORMAL HIGH (ref 35.0–45.0)
pCO2 arterial: 42.8 mmHg (ref 35.0–45.0)
pH, Arterial: 7.414 (ref 7.350–7.450)
pH, Arterial: 7.346 — ABNORMAL LOW (ref 7.350–7.450)
pH, Arterial: 7.323 — ABNORMAL LOW (ref 7.350–7.450)
pH, Arterial: 7.368 (ref 7.350–7.450)
pO2, Arterial: 81 mmHg (ref 80.0–100.0)

## 2013-04-11 LAB — BASIC METABOLIC PANEL
BUN: 13 mg/dL (ref 6–23)
CO2: 25 mEq/L (ref 19–32)
Calcium: 8.7 mg/dL (ref 8.4–10.5)
Chloride: 104 mEq/L (ref 96–112)
Creatinine, Ser: 1.05 mg/dL (ref 0.50–1.35)
GFR calc Af Amer: 84 mL/min — ABNORMAL LOW (ref >90)
GFR calc non Af Amer: 73 mL/min — ABNORMAL LOW (ref >90)
Glucose, Bld: 108 mg/dL — ABNORMAL HIGH (ref 70–99)
Potassium: 3.6 mEq/L (ref 3.5–5.1)
Sodium: 140 mEq/L (ref 135–145)

## 2013-04-11 LAB — URINALYSIS, ROUTINE W REFLEX MICROSCOPIC
Bilirubin Urine: NEGATIVE
Glucose, UA: NEGATIVE mg/dL
Hgb urine dipstick: NEGATIVE
Ketones, ur: NEGATIVE mg/dL
Nitrite: NEGATIVE
Protein, ur: NEGATIVE mg/dL
Specific Gravity, Urine: 1.012 (ref 1.005–1.030)
Urobilinogen, UA: 1 mg/dL (ref 0.0–1.0)
pH: 7.5 (ref 5.0–8.0)

## 2013-04-11 LAB — URINE MICROSCOPIC-ADD ON

## 2013-04-11 LAB — PLATELET COUNT: Platelets: 121 10*3/uL — ABNORMAL LOW (ref 150–400)

## 2013-04-11 LAB — HEMOGLOBIN AND HEMATOCRIT, BLOOD
HCT: 29.1 % — ABNORMAL LOW (ref 39.0–52.0)
Hemoglobin: 10.4 g/dL — ABNORMAL LOW (ref 13.0–17.0)

## 2013-04-11 LAB — CREATININE, SERUM
Creatinine, Ser: 1.07 mg/dL (ref 0.50–1.35)
GFR calc Af Amer: 82 mL/min — ABNORMAL LOW (ref >90)
GFR calc non Af Amer: 71 mL/min — ABNORMAL LOW (ref >90)

## 2013-04-11 LAB — MAGNESIUM: Magnesium: 2.7 mg/dL — ABNORMAL HIGH (ref 1.5–2.5)

## 2013-04-11 LAB — GLUCOSE, CAPILLARY: Glucose-Capillary: 102 mg/dL — ABNORMAL HIGH (ref 70–99)

## 2013-04-11 LAB — PROTIME-INR: Prothrombin Time: 16.8 seconds — ABNORMAL HIGH (ref 11.6–15.2)

## 2013-04-11 SURGERY — CORONARY ARTERY BYPASS GRAFTING (CABG)
Anesthesia: General | Site: Chest | Wound class: Clean

## 2013-04-11 MED ORDER — SUCCINYLCHOLINE CHLORIDE 20 MG/ML IJ SOLN
INTRAMUSCULAR | Status: DC | PRN
  Administered 2013-04-11: 140 mg via INTRAVENOUS

## 2013-04-11 MED ORDER — BISACODYL 5 MG PO TBEC
10.0000 mg | DELAYED_RELEASE_TABLET | Freq: Every day | ORAL | Status: DC
  Administered 2013-04-12 – 2013-04-13 (×2): 10 mg via ORAL
  Filled 2013-04-11 (×2): qty 2

## 2013-04-11 MED ORDER — ONDANSETRON HCL 4 MG/2ML IJ SOLN: 4.0000 mg | Freq: Four times a day (QID) | INTRAMUSCULAR | Status: DC | PRN

## 2013-04-11 MED ORDER — SODIUM CHLORIDE 0.9 % IJ SOLN
OROMUCOSAL | Status: DC | PRN
  Administered 2013-04-11: 09:00:00 via TOPICAL

## 2013-04-11 MED ORDER — SODIUM CHLORIDE 0.9 % IV SOLN
200.0000 ug | INTRAVENOUS | Status: DC | PRN
  Administered 2013-04-11: 0.2 ug/kg/h via INTRAVENOUS

## 2013-04-11 MED ORDER — DEXTROSE 5 % IV SOLN
1.5000 g | Freq: Two times a day (BID) | INTRAVENOUS | Status: AC
  Administered 2013-04-11 – 2013-04-13 (×4): 1.5 g via INTRAVENOUS
  Filled 2013-04-11 (×5): qty 1.5

## 2013-04-11 MED ORDER — DOCUSATE SODIUM 100 MG PO CAPS
200.0000 mg | ORAL_CAPSULE | Freq: Every day | ORAL | Status: DC
  Administered 2013-04-12 – 2013-04-13 (×2): 200 mg via ORAL
  Filled 2013-04-11 (×2): qty 2

## 2013-04-11 MED ORDER — FENTANYL CITRATE 0.05 MG/ML IJ SOLN
INTRAMUSCULAR | Status: AC
  Filled 2013-04-11: qty 2

## 2013-04-11 MED ORDER — PANTOPRAZOLE SODIUM 40 MG PO TBEC
40.0000 mg | DELAYED_RELEASE_TABLET | Freq: Every day | ORAL | Status: DC
  Administered 2013-04-13: 40 mg via ORAL
  Filled 2013-04-11: qty 1

## 2013-04-11 MED ORDER — DEXMEDETOMIDINE HCL IN NACL 200 MCG/50ML IV SOLN
0.4000 ug/kg/h | Freq: Once | INTRAVENOUS | Status: DC
  Filled 2013-04-11: qty 50

## 2013-04-11 MED ORDER — HEMOSTATIC AGENTS (NO CHARGE) OPTIME
TOPICAL | Status: DC | PRN
  Administered 2013-04-11: 1 via TOPICAL

## 2013-04-11 MED ORDER — ACETAMINOPHEN 160 MG/5ML PO SOLN
650.0000 mg | Freq: Once | ORAL | Status: AC
  Administered 2013-04-11: 650 mg

## 2013-04-11 MED ORDER — VECURONIUM BROMIDE 10 MG IV SOLR
INTRAVENOUS | Status: DC | PRN
  Administered 2013-04-11 (×2): 5 mg via INTRAVENOUS

## 2013-04-11 MED ORDER — SODIUM CHLORIDE 0.9 % IJ SOLN: 3.0000 mL | INTRAMUSCULAR | Status: DC | PRN

## 2013-04-11 MED ORDER — CHLORHEXIDINE GLUCONATE CLOTH 2 % EX PADS
6.0000 | MEDICATED_PAD | Freq: Every day | CUTANEOUS | Status: DC
  Administered 2013-04-11: 6 via TOPICAL

## 2013-04-11 MED ORDER — LACTATED RINGERS IV SOLN: INTRAVENOUS | Status: DC

## 2013-04-11 MED ORDER — MIDAZOLAM HCL 2 MG/2ML IJ SOLN
INTRAMUSCULAR | Status: AC
  Filled 2013-04-11: qty 2

## 2013-04-11 MED ORDER — 0.9 % SODIUM CHLORIDE (POUR BTL) OPTIME
TOPICAL | Status: DC | PRN
  Administered 2013-04-11: 1000 mL

## 2013-04-11 MED ORDER — MUPIROCIN 2 % EX OINT
1.0000 "application " | TOPICAL_OINTMENT | Freq: Two times a day (BID) | CUTANEOUS | Status: DC
  Administered 2013-04-11 – 2013-04-15 (×8): 1 via NASAL
  Filled 2013-04-11: qty 22

## 2013-04-11 MED ORDER — BISACODYL 10 MG RE SUPP: 10.0000 mg | Freq: Every day | RECTAL | Status: DC

## 2013-04-11 MED ORDER — NITROGLYCERIN IN D5W 200-5 MCG/ML-% IV SOLN: 0.0000 ug/min | INTRAVENOUS | Status: DC

## 2013-04-11 MED ORDER — MORPHINE SULFATE 2 MG/ML IJ SOLN
1.0000 mg | INTRAMUSCULAR | Status: AC | PRN
  Administered 2013-04-11: 1 mg via INTRAVENOUS
  Administered 2013-04-11: 3 mg via INTRAVENOUS
  Filled 2013-04-11 (×2): qty 1

## 2013-04-11 MED ORDER — MUPIROCIN 2 % EX OINT
1.0000 "application " | TOPICAL_OINTMENT | Freq: Two times a day (BID) | CUTANEOUS | Status: DC
  Filled 2013-04-11: qty 22

## 2013-04-11 MED ORDER — SODIUM CHLORIDE 0.9 % IV SOLN
INTRAVENOUS | Status: DC
  Filled 2013-04-11: qty 1

## 2013-04-11 MED ORDER — DEXMEDETOMIDINE HCL IN NACL 200 MCG/50ML IV SOLN
0.1000 ug/kg/h | INTRAVENOUS | Status: DC
  Filled 2013-04-11: qty 50

## 2013-04-11 MED ORDER — PROTAMINE SULFATE 10 MG/ML IV SOLN
INTRAVENOUS | Status: DC | PRN
  Administered 2013-04-11: 350 mg via INTRAVENOUS

## 2013-04-11 MED ORDER — SODIUM CHLORIDE 0.45 % IV SOLN
INTRAVENOUS | Status: DC
  Administered 2013-04-11: 13:00:00 via INTRAVENOUS

## 2013-04-11 MED ORDER — INSULIN ASPART 100 UNIT/ML ~~LOC~~ SOLN: 0.0000 [IU] | SUBCUTANEOUS | Status: DC

## 2013-04-11 MED ORDER — ARTIFICIAL TEARS OP OINT
TOPICAL_OINTMENT | OPHTHALMIC | Status: DC | PRN
  Administered 2013-04-11: 1 via OPHTHALMIC

## 2013-04-11 MED ORDER — ACETAMINOPHEN 650 MG RE SUPP: 650.0000 mg | Freq: Once | RECTAL | Status: AC

## 2013-04-11 MED ORDER — HEPARIN SODIUM (PORCINE) 1000 UNIT/ML IJ SOLN
INTRAMUSCULAR | Status: DC | PRN
  Administered 2013-04-11: 38 mL via INTRAVENOUS

## 2013-04-11 MED ORDER — VANCOMYCIN HCL IN DEXTROSE 1-5 GM/200ML-% IV SOLN
1000.0000 mg | Freq: Once | INTRAVENOUS | Status: AC
  Administered 2013-04-11: 1000 mg via INTRAVENOUS
  Filled 2013-04-11: qty 200

## 2013-04-11 MED ORDER — POTASSIUM CHLORIDE 10 MEQ/50ML IV SOLN
10.0000 meq | INTRAVENOUS | Status: AC
  Administered 2013-04-11 (×3): 10 meq via INTRAVENOUS

## 2013-04-11 MED ORDER — ACETAMINOPHEN 160 MG/5ML PO SOLN
1000.0000 mg | Freq: Four times a day (QID) | ORAL | Status: DC
  Administered 2013-04-12: 1000 mg

## 2013-04-11 MED ORDER — LACTATED RINGERS IV SOLN: 500.0000 mL | Freq: Once | INTRAVENOUS | Status: AC | PRN

## 2013-04-11 MED ORDER — METOPROLOL TARTRATE 12.5 MG HALF TABLET
12.5000 mg | ORAL_TABLET | Freq: Once | ORAL | Status: AC
  Administered 2013-04-11: 12.5 mg via ORAL

## 2013-04-11 MED ORDER — METOPROLOL TARTRATE 25 MG/10 ML ORAL SUSPENSION
12.5000 mg | Freq: Two times a day (BID) | ORAL | Status: DC
  Administered 2013-04-11: 12.5 mg
  Filled 2013-04-11 (×3): qty 5

## 2013-04-11 MED ORDER — PHENYLEPHRINE HCL 10 MG/ML IJ SOLN
10.0000 mg | INTRAVENOUS | Status: DC | PRN
  Administered 2013-04-11: 10 ug/min via INTRAVENOUS

## 2013-04-11 MED ORDER — MAGNESIUM SULFATE 40 MG/ML IJ SOLN
4.0000 g | Freq: Once | INTRAMUSCULAR | Status: AC
  Administered 2013-04-11: 4 g via INTRAVENOUS
  Filled 2013-04-11: qty 100

## 2013-04-11 MED ORDER — FAMOTIDINE IN NACL 20-0.9 MG/50ML-% IV SOLN
20.0000 mg | Freq: Two times a day (BID) | INTRAVENOUS | Status: AC
  Filled 2013-04-11: qty 50

## 2013-04-11 MED ORDER — SODIUM CHLORIDE 0.9 % IV SOLN
INTRAVENOUS | Status: DC | PRN
  Administered 2013-04-11: 12:00:00 via INTRAVENOUS

## 2013-04-11 MED ORDER — SODIUM CHLORIDE 0.9 % IV SOLN: INTRAVENOUS | Status: DC

## 2013-04-11 MED ORDER — ALBUMIN HUMAN 5 % IV SOLN
INTRAVENOUS | Status: DC | PRN
  Administered 2013-04-11: 12:00:00 via INTRAVENOUS

## 2013-04-11 MED ORDER — MORPHINE SULFATE 2 MG/ML IJ SOLN
2.0000 mg | INTRAMUSCULAR | Status: DC | PRN
  Administered 2013-04-11: 4 mg via INTRAVENOUS
  Administered 2013-04-12 – 2013-04-13 (×7): 2 mg via INTRAVENOUS
  Filled 2013-04-11 (×2): qty 1
  Filled 2013-04-11: qty 2
  Filled 2013-04-11 (×6): qty 1

## 2013-04-11 MED ORDER — ACETAMINOPHEN 500 MG PO TABS
1000.0000 mg | ORAL_TABLET | Freq: Four times a day (QID) | ORAL | Status: DC
  Administered 2013-04-12 – 2013-04-13 (×5): 1000 mg via ORAL
  Filled 2013-04-11 (×9): qty 2

## 2013-04-11 MED ORDER — SODIUM CHLORIDE 0.9 % IV SOLN: 250.0000 mL | INTRAVENOUS | Status: DC

## 2013-04-11 MED ORDER — SODIUM CHLORIDE 0.9 % IV SOLN
10.0000 g | INTRAVENOUS | Status: DC | PRN
  Administered 2013-04-11: 5 g/h via INTRAVENOUS

## 2013-04-11 MED ORDER — SODIUM CHLORIDE 0.9 % IV SOLN
100.0000 [IU] | INTRAVENOUS | Status: DC | PRN
  Administered 2013-04-11: 1 [IU]/h via INTRAVENOUS

## 2013-04-11 MED ORDER — SODIUM CHLORIDE 0.9 % IJ SOLN
3.0000 mL | Freq: Two times a day (BID) | INTRAMUSCULAR | Status: DC
  Administered 2013-04-12 – 2013-04-13 (×3): 3 mL via INTRAVENOUS

## 2013-04-11 MED ORDER — ASPIRIN EC 325 MG PO TBEC
325.0000 mg | DELAYED_RELEASE_TABLET | Freq: Every day | ORAL | Status: DC
  Administered 2013-04-12 – 2013-04-13 (×2): 325 mg via ORAL
  Filled 2013-04-11 (×2): qty 1

## 2013-04-11 MED ORDER — PROPOFOL 10 MG/ML IV BOLUS
INTRAVENOUS | Status: DC | PRN
  Administered 2013-04-11: 50 mg via INTRAVENOUS

## 2013-04-11 MED ORDER — MIDAZOLAM HCL 2 MG/2ML IJ SOLN: 2.0000 mg | INTRAMUSCULAR | Status: DC | PRN

## 2013-04-11 MED ORDER — PHENYLEPHRINE HCL 10 MG/ML IJ SOLN
0.0000 ug/min | INTRAVENOUS | Status: DC
  Filled 2013-04-11: qty 2

## 2013-04-11 MED ORDER — ROCURONIUM BROMIDE 100 MG/10ML IV SOLN
INTRAVENOUS | Status: DC | PRN
  Administered 2013-04-11: 20 mg via INTRAVENOUS
  Administered 2013-04-11: 50 mg via INTRAVENOUS
  Administered 2013-04-11: 30 mg via INTRAVENOUS
  Administered 2013-04-11: 100 mg via INTRAVENOUS
  Administered 2013-04-11: 50 mg via INTRAVENOUS

## 2013-04-11 MED ORDER — CHLORHEXIDINE GLUCONATE CLOTH 2 % EX PADS
6.0000 | MEDICATED_PAD | Freq: Every day | CUTANEOUS | Status: DC
  Administered 2013-04-12 – 2013-04-15 (×4): 6 via TOPICAL

## 2013-04-11 MED ORDER — ASPIRIN 81 MG PO CHEW: 324.0000 mg | CHEWABLE_TABLET | Freq: Every day | ORAL | Status: DC

## 2013-04-11 MED ORDER — ALBUMIN HUMAN 5 % IV SOLN
250.0000 mL | INTRAVENOUS | Status: AC | PRN
  Administered 2013-04-11: 250 mL via INTRAVENOUS

## 2013-04-11 MED ORDER — LACTATED RINGERS IV SOLN
INTRAVENOUS | Status: DC | PRN
  Administered 2013-04-11: 06:00:00 via INTRAVENOUS

## 2013-04-11 MED ORDER — METOCLOPRAMIDE HCL 5 MG/ML IJ SOLN
10.0000 mg | Freq: Four times a day (QID) | INTRAMUSCULAR | Status: AC
  Administered 2013-04-11 – 2013-04-12 (×4): 10 mg via INTRAVENOUS
  Filled 2013-04-11 (×4): qty 2

## 2013-04-11 MED ORDER — METOPROLOL TARTRATE 12.5 MG HALF TABLET
12.5000 mg | ORAL_TABLET | Freq: Two times a day (BID) | ORAL | Status: DC
  Administered 2013-04-12: 12.5 mg via ORAL
  Filled 2013-04-11 (×3): qty 1

## 2013-04-11 MED ORDER — SODIUM CHLORIDE 0.9 % IV SOLN
5.0000 g/h | Freq: Once | INTRAVENOUS | Status: DC
  Filled 2013-04-11: qty 20

## 2013-04-11 MED ORDER — FENTANYL CITRATE 0.05 MG/ML IJ SOLN
INTRAMUSCULAR | Status: DC | PRN
  Administered 2013-04-11: 250 ug via INTRAVENOUS
  Administered 2013-04-11: 150 ug via INTRAVENOUS
  Administered 2013-04-11: 250 ug via INTRAVENOUS
  Administered 2013-04-11 (×2): 100 ug via INTRAVENOUS
  Administered 2013-04-11: 500 ug via INTRAVENOUS
  Administered 2013-04-11: 250 ug via INTRAVENOUS

## 2013-04-11 MED ORDER — NITROGLYCERIN IN D5W 200-5 MCG/ML-% IV SOLN
INTRAVENOUS | Status: DC | PRN
  Administered 2013-04-11: 5 ug/min via INTRAVENOUS

## 2013-04-11 MED ORDER — INSULIN REGULAR BOLUS VIA INFUSION
0.0000 [IU] | Freq: Three times a day (TID) | INTRAVENOUS | Status: DC
  Filled 2013-04-11: qty 10

## 2013-04-11 MED ORDER — METOPROLOL TARTRATE 1 MG/ML IV SOLN
2.5000 mg | INTRAVENOUS | Status: DC | PRN
  Administered 2013-04-12: 2.5 mg via INTRAVENOUS
  Filled 2013-04-11: qty 5

## 2013-04-11 MED ORDER — OXYCODONE HCL 5 MG PO TABS
5.0000 mg | ORAL_TABLET | ORAL | Status: DC | PRN
  Administered 2013-04-12 (×2): 5 mg via ORAL
  Filled 2013-04-11 (×2): qty 1

## 2013-04-11 MED ORDER — MIDAZOLAM HCL 5 MG/5ML IJ SOLN
INTRAMUSCULAR | Status: DC | PRN
  Administered 2013-04-11 (×2): 3 mg via INTRAVENOUS
  Administered 2013-04-11: 4 mg via INTRAVENOUS
  Administered 2013-04-11: 2 mg via INTRAVENOUS
  Administered 2013-04-11: 3 mg via INTRAVENOUS
  Administered 2013-04-11: 2 mg via INTRAVENOUS
  Administered 2013-04-11: 3 mg via INTRAVENOUS

## 2013-04-11 SURGICAL SUPPLY — 112 items
ATTRACTOMAT 16X20 MAGNETIC DRP (DRAPES) ×3 IMPLANT
BAG DECANTER FOR FLEXI CONT (MISCELLANEOUS) ×3 IMPLANT
BANDAGE ELASTIC 4 VELCRO ST LF (GAUZE/BANDAGES/DRESSINGS) ×3 IMPLANT
BANDAGE ELASTIC 6 VELCRO ST LF (GAUZE/BANDAGES/DRESSINGS) ×3 IMPLANT
BANDAGE GAUZE ELAST BULKY 4 IN (GAUZE/BANDAGES/DRESSINGS) ×3 IMPLANT
BLADE STERNUM SYSTEM 6 (BLADE) ×3 IMPLANT
BLADE SURG 11 STRL SS (BLADE) ×3 IMPLANT
BLADE SURG ROTATE 9660 (MISCELLANEOUS) ×3 IMPLANT
CANISTER SUCTION 2500CC (MISCELLANEOUS) ×3 IMPLANT
CANN PRFSN .5XCNCT 15X34-48 (MISCELLANEOUS) ×2
CANNULA AORTIC HI-FLOW 6.5M20F (CANNULA) ×3 IMPLANT
CANNULA PRFSN .5XCNCT 15X34-48 (MISCELLANEOUS) ×2 IMPLANT
CANNULA VEN 2 STAGE (MISCELLANEOUS) ×1
CATH CPB KIT GERHARDT (MISCELLANEOUS) ×3 IMPLANT
CATH THORACIC 28FR (CATHETERS) ×3 IMPLANT
CATH THORACIC 36FR (CATHETERS) IMPLANT
CATH THORACIC 36FR RT ANG (CATHETERS) IMPLANT
CLIP TI MEDIUM 24 (CLIP) IMPLANT
CLIP TI WIDE RED SMALL 24 (CLIP) ×3 IMPLANT
CLOTH BEACON ORANGE TIMEOUT ST (SAFETY) ×3 IMPLANT
COVER SURGICAL LIGHT HANDLE (MISCELLANEOUS) ×3 IMPLANT
CRADLE DONUT ADULT HEAD (MISCELLANEOUS) ×3 IMPLANT
DERMABOND ADVANCED (GAUZE/BANDAGES/DRESSINGS) ×1
DERMABOND ADVANCED .7 DNX12 (GAUZE/BANDAGES/DRESSINGS) ×2 IMPLANT
DRAIN CHANNEL 28F RND 3/8 FF (WOUND CARE) ×3 IMPLANT
DRAPE CARDIOVASCULAR INCISE (DRAPES) ×1
DRAPE SLUSH/WARMER DISC (DRAPES) ×3 IMPLANT
DRAPE SRG 135X102X78XABS (DRAPES) ×2 IMPLANT
DRSG AQUACEL AG ADV 3.5X14 (GAUZE/BANDAGES/DRESSINGS) ×3 IMPLANT
DRSG COVADERM 4X14 (GAUZE/BANDAGES/DRESSINGS) IMPLANT
ELECT BLADE 4.0 EZ CLEAN MEGAD (MISCELLANEOUS) ×3
ELECT REM PT RETURN 9FT ADLT (ELECTROSURGICAL) ×6
ELECTRODE BLDE 4.0 EZ CLN MEGD (MISCELLANEOUS) ×2 IMPLANT
ELECTRODE REM PT RTRN 9FT ADLT (ELECTROSURGICAL) ×4 IMPLANT
GLOVE BIO SURGEON STRL SZ 6 (GLOVE) ×3 IMPLANT
GLOVE BIO SURGEON STRL SZ 6.5 (GLOVE) ×15 IMPLANT
GLOVE BIO SURGEON STRL SZ7 (GLOVE) ×3 IMPLANT
GLOVE BIO SURGEON STRL SZ7.5 (GLOVE) IMPLANT
GLOVE BIOGEL PI IND STRL 6 (GLOVE) ×4 IMPLANT
GLOVE BIOGEL PI IND STRL 6.5 (GLOVE) ×12 IMPLANT
GLOVE BIOGEL PI IND STRL 7.0 (GLOVE) ×4 IMPLANT
GLOVE BIOGEL PI INDICATOR 6 (GLOVE) ×2
GLOVE BIOGEL PI INDICATOR 6.5 (GLOVE) ×6
GLOVE BIOGEL PI INDICATOR 7.0 (GLOVE) ×2
GOWN STRL NON-REIN LRG LVL3 (GOWN DISPOSABLE) ×21 IMPLANT
HEMOSTAT POWDER SURGIFOAM 1G (HEMOSTASIS) ×9 IMPLANT
HEMOSTAT SURGICEL 2X14 (HEMOSTASIS) ×6 IMPLANT
INSERT FOGARTY 61MM (MISCELLANEOUS) IMPLANT
INSERT FOGARTY XLG (MISCELLANEOUS) IMPLANT
KIT BASIN OR (CUSTOM PROCEDURE TRAY) ×3 IMPLANT
KIT ROOM TURNOVER OR (KITS) ×3 IMPLANT
KIT SUCTION CATH 14FR (SUCTIONS) ×6 IMPLANT
KIT VASOVIEW W/TROCAR VH 2000 (KITS) ×3 IMPLANT
LEAD PACING MYOCARDI (MISCELLANEOUS) ×3 IMPLANT
MARKER GRAFT CORONARY BYPASS (MISCELLANEOUS) ×9 IMPLANT
NS IRRIG 1000ML POUR BTL (IV SOLUTION) ×18 IMPLANT
PACK OPEN HEART (CUSTOM PROCEDURE TRAY) ×3 IMPLANT
PAD ARMBOARD 7.5X6 YLW CONV (MISCELLANEOUS) ×6 IMPLANT
PAD ELECT DEFIB RADIOL ZOLL (MISCELLANEOUS) ×3 IMPLANT
PENCIL BUTTON HOLSTER BLD 10FT (ELECTRODE) ×3 IMPLANT
PUNCH AORTIC ROTATE 4.0MM (MISCELLANEOUS) IMPLANT
PUNCH AORTIC ROTATE 4.5MM 8IN (MISCELLANEOUS) ×3 IMPLANT
PUNCH AORTIC ROTATE 5MM 8IN (MISCELLANEOUS) IMPLANT
SET CARDIOPLEGIA MPS 5001102 (MISCELLANEOUS) ×3 IMPLANT
SOLUTION ANTI FOG 6CC (MISCELLANEOUS) IMPLANT
SPONGE GAUZE 4X4 12PLY (GAUZE/BANDAGES/DRESSINGS) ×6 IMPLANT
SPONGE LAP 18X18 X RAY DECT (DISPOSABLE) ×6 IMPLANT
SPONGE LAP 4X18 X RAY DECT (DISPOSABLE) IMPLANT
SUT BONE WAX W31G (SUTURE) ×3 IMPLANT
SUT MNCRL AB 4-0 PS2 18 (SUTURE) ×3 IMPLANT
SUT PROLENE 3 0 SH DA (SUTURE) IMPLANT
SUT PROLENE 3 0 SH1 36 (SUTURE) ×3 IMPLANT
SUT PROLENE 4 0 RB 1 (SUTURE)
SUT PROLENE 4 0 SH DA (SUTURE) IMPLANT
SUT PROLENE 4 0 TF (SUTURE) ×6 IMPLANT
SUT PROLENE 4-0 RB1 .5 CRCL 36 (SUTURE) IMPLANT
SUT PROLENE 5 0 C 1 36 (SUTURE) IMPLANT
SUT PROLENE 6 0 C 1 30 (SUTURE) IMPLANT
SUT PROLENE 6 0 CC (SUTURE) ×12 IMPLANT
SUT PROLENE 7 0 BV 1 (SUTURE) ×3 IMPLANT
SUT PROLENE 7 0 BV1 MDA (SUTURE) ×3 IMPLANT
SUT PROLENE 7.0 RB 3 (SUTURE) IMPLANT
SUT PROLENE 8 0 BV175 6 (SUTURE) ×9 IMPLANT
SUT SILK  1 MH (SUTURE)
SUT SILK 1 MH (SUTURE) IMPLANT
SUT SILK 2 0 SH CR/8 (SUTURE) IMPLANT
SUT SILK 3 0 SH CR/8 (SUTURE) IMPLANT
SUT STEEL 6MS V (SUTURE) ×3 IMPLANT
SUT STEEL STERNAL CCS#1 18IN (SUTURE) IMPLANT
SUT STEEL SZ 6 DBL 3X14 BALL (SUTURE) ×3 IMPLANT
SUT VIC AB 1 CTX 18 (SUTURE) ×6 IMPLANT
SUT VIC AB 1 CTX 36 (SUTURE)
SUT VIC AB 1 CTX36XBRD ANBCTR (SUTURE) IMPLANT
SUT VIC AB 2-0 CT1 27 (SUTURE) ×1
SUT VIC AB 2-0 CT1 TAPERPNT 27 (SUTURE) ×2 IMPLANT
SUT VIC AB 2-0 CTX 27 (SUTURE) ×3 IMPLANT
SUT VIC AB 3-0 SH 27 (SUTURE)
SUT VIC AB 3-0 SH 27X BRD (SUTURE) IMPLANT
SUT VIC AB 3-0 X1 27 (SUTURE) IMPLANT
SUT VICRYL 4-0 PS2 18IN ABS (SUTURE) IMPLANT
SUTURE E-PAK OPEN HEART (SUTURE) ×3 IMPLANT
SYSTEM SAHARA CHEST DRAIN ATS (WOUND CARE) ×3 IMPLANT
TAPE CLOTH SURG 4X10 WHT LF (GAUZE/BANDAGES/DRESSINGS) ×3 IMPLANT
TAPE PAPER 2X10 WHT MICROPORE (GAUZE/BANDAGES/DRESSINGS) ×3 IMPLANT
TOWEL OR 17X24 6PK STRL BLUE (TOWEL DISPOSABLE) ×6 IMPLANT
TOWEL OR 17X26 10 PK STRL BLUE (TOWEL DISPOSABLE) ×6 IMPLANT
TRAY FOLEY IC TEMP SENS 14FR (CATHETERS) ×3 IMPLANT
TUBE FEEDING 8FR 16IN STR KANG (MISCELLANEOUS) ×3 IMPLANT
TUBE SUCT INTRACARD DLP 20F (MISCELLANEOUS) ×3 IMPLANT
TUBING INSUFFLATION 10FT LAP (TUBING) ×3 IMPLANT
UNDERPAD 30X30 INCONTINENT (UNDERPADS AND DIAPERS) ×3 IMPLANT
WATER STERILE IRR 1000ML POUR (IV SOLUTION) ×6 IMPLANT

## 2013-04-11 NOTE — Progress Notes (Signed)
Pt achieved around VC with very minimal effort, CO2 was slightly elevated on pre-extubation ABG. Pt placed back on SIMV(PRVC) 40% +5 10 600. RT will attempt wean parameters again in about an hour. RT will continue to monitor.

## 2013-04-11 NOTE — Procedures (Signed)
Extubation Procedure Note  Patient Details:   Name: David Hogan DOB: 02/24/1946 MRN: [id number]   Airway Documentation:   Patient extubated to 4 lpm nasal cannula.  VC 650 ml, NIF -25, patient able to lift head off bed.  Patient able to breathe around deflated cuff and vocalize post procedure.  No complications, tolerated well.   Evaluation  O2 sats: stable throughout Complications: No apparent complications Patient did tolerate procedure well. Bilateral Breath Sounds: Clear;Diminished Suctioning: Airway Yes  Adelina Collard, Aloha Gell 04/11/2013, 7:59 PM

## 2013-04-11 NOTE — Brief Op Note (Addendum)
      [address]       [place] [address]             [phone]        PATIENT:  David Hogan  67 y.o. male  PRE-OPERATIVE DIAGNOSIS:  1. NSTEMI 2.CAD  POST-OPERATIVE DIAGNOSIS:  1.NSTEMI  2.CAD  PROCEDURE:  INTRAOPERATIVE TRANSESOPHAGEAL ECHOCARDIOGRAM, MEDIAN STERNOTOMY for CORONARY ARTERY BYPASS GRAFTING (CABG)  x3 (LIMA to LAD, SVG to DIAGONAL,a dn SVG to RAMUS INTERMEDIATE) using right thigh greater saphenous vein and left internal mammary.   SURGEON:  Surgeon(s) and Role:    * Delight Ovens, MD - Primary  PHYSICIAN ASSISTANT: Doree Fudge PA-C   ANESTHESIA:   general  EBL:  Total I/O In: -  Out: 550 [Urine:550]   DRAINS: Chest Tube(s) in the Mediastinal and pleural spaces   COUNTS CORRECT:  YES  DICTATION: .Dragon Dictation  PLAN OF CARE: Admit to inpatient   PATIENT DISPOSITION:  ICU - intubated and hemodynamically stable.   Delay start of Pharmacological VTE agent (>24hrs) due to surgical blood loss or risk of bleeding: yes  PRE OP WEIGHT: 120 kg

## 2013-04-11 NOTE — Progress Notes (Signed)
  Echocardiogram Echocardiogram Transesophageal has been performed.  David Hogan 04/11/2013, 8:43 AM

## 2013-04-11 NOTE — Anesthesia Procedure Notes (Signed)
Procedures The patient was identified and consent obtained.  TO was performed, and full barrier precautions were used.  The skin was anesthetized with lidocaine.  Once the vein was located with the 22 ga. needle using ultrasound guidance , the wire was inserted into the vein.  The wire location was confirmed with ultrasound.  The insertion site was dilated and the introducer was carefully inserted and sutured in place. The PAC was checked, and floated into the PA.  Once in the PA, the catheter was secured. The patient tolerated the procedure well.  CXR was ordered for PACU. Start: 1610 End: 0641 J. Claybon Jabs, MD

## 2013-04-11 NOTE — Transfer of Care (Signed)
Immediate Anesthesia Transfer of Care Note  Patient: David Hogan  Procedure(s) Performed: Procedure(s) with comments: CORONARY ARTERY BYPASS GRAFTING (CABG)  (N/A) - x3 using right greater saphenous vein and left internal mammary. INTRAOPERATIVE TRANSESOPHAGEAL ECHOCARDIOGRAM (N/A)  Patient Location: ICU  Anesthesia Type:General  Level of Consciousness: sedated and patient cooperative  Airway & Oxygen Therapy: Patient remains intubated per anesthesia plan and Patient placed on Ventilator (see vital sign flow sheet for setting)  Post-op Assessment: Report given to PACU RN  Post vital signs: Reviewed and stable  Complications: No apparent anesthesia complications

## 2013-04-11 NOTE — Preoperative (Signed)
Beta Blockers   Reason not to administer Beta Blockers:Not Applicable 

## 2013-04-11 NOTE — Anesthesia Preprocedure Evaluation (Signed)
Anesthesia Evaluation  Patient identified by MRN, date of birth, ID band Patient awake    Reviewed: Allergy & Precautions, H&P , NPO status , Patient's Chart, lab work & pertinent test results, reviewed documented beta blocker date and time   Airway Mallampati: III TM Distance: <3 FB Neck ROM: Limited  Mouth opening: Limited Mouth Opening  Dental  (+) Chipped and Poor Dentition,    Pulmonary shortness of breath, asthma , sleep apnea and Continuous Positive Airway Pressure Ventilation ,    Pulmonary exam normal       Cardiovascular hypertension, Pt. on medications + Past MI + dysrhythmias Supra Ventricular Tachycardia + Valvular Problems/Murmurs     Neuro/Psych    GI/Hepatic   Endo/Other    Renal/GU      Musculoskeletal   Abdominal Normal abdominal exam  (+)   Peds  Hematology   Anesthesia Other Findings   Reproductive/Obstetrics                           Anesthesia Physical Anesthesia Plan  ASA: IV  Anesthesia Plan: General   Post-op Pain Management:    Induction: Intravenous  Airway Management Planned: Oral ETT and Video Laryngoscope Planned  Additional Equipment: Arterial line, CVP, PA Cath, 3D TEE and Ultrasound Guidance Line Placement  Intra-op Plan:   Post-operative Plan: Extubation in OR  Informed Consent: I have reviewed the patients History and Physical, chart, labs and discussed the procedure including the risks, benefits and alternatives for the proposed anesthesia with the patient or authorized representative who has indicated his/her understanding and acceptance.   Dental advisory given  Plan Discussed with: CRNA, Anesthesiologist and Surgeon  Anesthesia Plan Comments:         Anesthesia Quick Evaluation

## 2013-04-11 NOTE — Progress Notes (Signed)
Patient ID: David Hogan, male   DOB: 26-Mar-1946, 67 y.o.   MRN: [id number]  SICU Evening Rounds:  Hemodynamically stable  Weaning vent  Urine output good  CT output low  CBC    Component Value Date/Time   WBC 7.8 04/11/2013 1311   RBC 4.10* 04/11/2013 1311   HGB 12.8* 04/11/2013 1311   HCT 35.2* 04/11/2013 1311   PLT 104* 04/11/2013 1311   MCV 85.9 04/11/2013 1311   MCH 31.2 04/11/2013 1311   MCHC 36.4* 04/11/2013 1311   RDW 12.9 04/11/2013 1311   LYMPHSABS 1.1 04/09/2013 0009   MONOABS 0.1 04/09/2013 0009   EOSABS 0.0 04/09/2013 0009   BASOSABS 0.0 04/09/2013 0009    BMET    Component Value Date/Time   NA 140 04/11/2013 1230   K 3.8 04/11/2013 1230   CL 104 04/11/2013 0528   CO2 25 04/11/2013 0528   GLUCOSE 113* 04/11/2013 1230   BUN 13 04/11/2013 0528   CREATININE 1.05 04/11/2013 0528   CALCIUM 8.7 04/11/2013 0528   GFRNONAA 73* 04/11/2013 0528   GFRAA 84* 04/11/2013 0528    Extubate when ready.

## 2013-04-11 NOTE — Anesthesia Postprocedure Evaluation (Signed)
  Anesthesia Post-op Note  Patient: David Hogan  Procedure(s) Performed: Procedure(s) with comments: CORONARY ARTERY BYPASS GRAFTING (CABG)  (N/A) - x3 using right greater saphenous vein and left internal mammary. INTRAOPERATIVE TRANSESOPHAGEAL ECHOCARDIOGRAM (N/A)  Patient Location: ICU  Anesthesia Type:General  Level of Consciousness: sedated and unresponsive  Airway and Oxygen Therapy: Patient remains intubated per anesthesia plan and Patient placed on Ventilator (see vital sign flow sheet for setting)  Post-op Pain: none  Post-op Assessment: Post-op Vital signs reviewed, Patient's Cardiovascular Status Stable and Respiratory Function Stable  Post-op Vital Signs: Reviewed and stable  Complications: No apparent anesthesia complications

## 2013-04-12 ENCOUNTER — Inpatient Hospital Stay (HOSPITAL_COMMUNITY): Payer: Medicare Other

## 2013-04-12 ENCOUNTER — Encounter (HOSPITAL_COMMUNITY): Payer: Self-pay | Admitting: Cardiology

## 2013-04-12 DIAGNOSIS — T63301A Toxic effect of unspecified spider venom, accidental (unintentional), initial encounter: Secondary | ICD-10-CM

## 2013-04-12 DIAGNOSIS — I25119 Atherosclerotic heart disease of native coronary artery with unspecified angina pectoris: Secondary | ICD-10-CM | POA: Diagnosis present

## 2013-04-12 DIAGNOSIS — Z951 Presence of aortocoronary bypass graft: Secondary | ICD-10-CM | POA: Insufficient documentation

## 2013-04-12 DIAGNOSIS — E669 Obesity, unspecified: Secondary | ICD-10-CM

## 2013-04-12 DIAGNOSIS — I2510 Atherosclerotic heart disease of native coronary artery without angina pectoris: Secondary | ICD-10-CM

## 2013-04-12 HISTORY — DX: Atherosclerotic heart disease of native coronary artery without angina pectoris: I25.10

## 2013-04-12 LAB — GLUCOSE, CAPILLARY
Glucose-Capillary: 90 mg/dL (ref 70–99)
Glucose-Capillary: 96 mg/dL (ref 70–99)

## 2013-04-12 LAB — CREATININE, SERUM
Creatinine, Ser: 1.06 mg/dL (ref 0.50–1.35)
GFR calc Af Amer: 83 mL/min — ABNORMAL LOW (ref >90)
GFR calc non Af Amer: 72 mL/min — ABNORMAL LOW (ref >90)

## 2013-04-12 LAB — MAGNESIUM
Magnesium: 2.4 mg/dL (ref 1.5–2.5)
Magnesium: 2.2 mg/dL (ref 1.5–2.5)

## 2013-04-12 LAB — URINE CULTURE: Colony Count: 65000

## 2013-04-12 LAB — BASIC METABOLIC PANEL
Chloride: 104 mEq/L (ref 96–112)
Creatinine, Ser: 1.14 mg/dL (ref 0.50–1.35)
GFR calc Af Amer: 76 mL/min — ABNORMAL LOW (ref >90)
Potassium: 4.1 mEq/L (ref 3.5–5.1)

## 2013-04-12 LAB — CBC
MCH: 31.9 pg (ref 26.0–34.0)
MCHC: 36.4 g/dL — ABNORMAL HIGH (ref 30.0–36.0)
Platelets: 137 10*3/uL — ABNORMAL LOW (ref 150–400)
RDW: 13.2 % (ref 11.5–15.5)
RDW: 13 % (ref 11.5–15.5)
WBC: 15.7 10*3/uL — ABNORMAL HIGH (ref 4.0–10.5)

## 2013-04-12 LAB — POCT I-STAT, CHEM 8
BUN: 18 mg/dL (ref 6–23)
Calcium, Ion: 1.13 mmol/L (ref 1.13–1.30)
Chloride: 102 mEq/L (ref 96–112)
Glucose, Bld: 119 mg/dL — ABNORMAL HIGH (ref 70–99)
HCT: 40 % (ref 39.0–52.0)
TCO2: 27 mmol/L (ref 0–100)

## 2013-04-12 MED ORDER — POTASSIUM CHLORIDE CRYS ER 20 MEQ PO TBCR
20.0000 meq | EXTENDED_RELEASE_TABLET | Freq: Two times a day (BID) | ORAL | Status: AC
  Administered 2013-04-12 (×2): 20 meq via ORAL
  Filled 2013-04-12: qty 1

## 2013-04-12 MED ORDER — METOPROLOL TARTRATE 25 MG/10 ML ORAL SUSPENSION
12.5000 mg | Freq: Two times a day (BID) | ORAL | Status: DC
  Filled 2013-04-12 (×3): qty 5

## 2013-04-12 MED ORDER — FUROSEMIDE 10 MG/ML IJ SOLN
40.0000 mg | Freq: Two times a day (BID) | INTRAMUSCULAR | Status: AC
  Administered 2013-04-12 (×2): 40 mg via INTRAVENOUS
  Filled 2013-04-12 (×2): qty 4

## 2013-04-12 MED ORDER — INSULIN ASPART 100 UNIT/ML ~~LOC~~ SOLN
0.0000 [IU] | SUBCUTANEOUS | Status: DC
  Administered 2013-04-12: 2 [IU] via SUBCUTANEOUS

## 2013-04-12 MED ORDER — METOPROLOL TARTRATE 25 MG PO TABS
25.0000 mg | ORAL_TABLET | Freq: Two times a day (BID) | ORAL | Status: DC
  Administered 2013-04-12 – 2013-04-13 (×2): 25 mg via ORAL
  Filled 2013-04-12 (×3): qty 1

## 2013-04-12 MED ORDER — ENOXAPARIN SODIUM 40 MG/0.4ML ~~LOC~~ SOLN
40.0000 mg | Freq: Every day | SUBCUTANEOUS | Status: DC
  Administered 2013-04-12 – 2013-04-14 (×3): 40 mg via SUBCUTANEOUS
  Filled 2013-04-12 (×4): qty 0.4

## 2013-04-12 NOTE — Progress Notes (Signed)
1 Day Post-Op Procedure(s) (LRB): CORONARY ARTERY BYPASS GRAFTING (CABG)  (N/A) INTRAOPERATIVE TRANSESOPHAGEAL ECHOCARDIOGRAM (N/A) Subjective: No complaints  Objective: Vital signs in last 24 hours: Temp:  [97.9 F (36.6 C)-100.6 F (38.1 C)] 98.8 F (37.1 C) (09/06 1200) Pulse Rate:  [80-112] 102 (09/06 1200) Cardiac Rhythm:  [-] Sinus tachycardia (09/06 0800) Resp:  [12-35] 23 (09/06 1200) BP: (79-148)/(55-91) 129/73 mmHg (09/06 1200) SpO2:  [92 %-100 %] 96 % (09/06 1200) Arterial Line BP: (91-169)/(53-97) 157/85 mmHg (09/06 1200) FiO2 (%):  [40 %-50 %] 40 % (09/05 1900) Weight:  [122.5 kg (270 lb 1 oz)] 122.5 kg (270 lb 1 oz) (09/06 0500)  Hemodynamic parameters for last 24 hours: PAP: (24-60)/(11-37) 57/13 mmHg CO:  [5 L/min-7.3 L/min] 7.3 L/min CI:  [2.1 L/min/m2-3.1 L/min/m2] 3.1 L/min/m2  Intake/Output from previous day: 09/05 0701 - 09/06 0700 In: 6027.3 [I.V.:4303.3; Blood:674; IV Piggyback:1050] Out: 3650 [Urine:3200; Chest Tube:450] Intake/Output this shift: Total I/O In: 403 [P.O.:240; I.V.:163] Out: 450 [Urine:360; Chest Tube:90]  General appearance: alert and cooperative Neurologic: intact Heart: regular rate and rhythm, S1, S2 normal, no murmur, click, rub or gallop Lungs: diminished breath sounds bibasilar Extremities: edema moderate Wound: dressing dry  Lab Results:  Recent Labs  04/11/13 1900 04/11/13 1901 04/12/13 0441  WBC 12.1*  --  15.7*  HGB 13.1 12.2* 13.4  HCT 35.9* 36.0* 37.1*  PLT 132*  --  137*   BMET:  Recent Labs  04/11/13 0528  04/11/13 1901 04/12/13 0441  NA 140  < > 141 139  K 3.6  < > 4.5 4.1  CL 104  --  106 104  CO2 25  --   --  26  GLUCOSE 108*  < > 111* 124*  BUN 13  --  15 18  CREATININE 1.05  < > 1.10 1.14  CALCIUM 8.7  --   --  8.0*  < > = values in this interval not displayed.  PT/INR:  Recent Labs  04/11/13 1311  LABPROT 16.8*  INR 1.40   ABG    Component Value Date/Time   PHART 7.368 04/11/2013  2211   HCO3 24.4* 04/11/2013 2211   TCO2 26 04/11/2013 2211   ACIDBASEDEF 1.0 04/11/2013 2211   O2SAT 95.0 04/11/2013 2211   CBG (last 3)   Recent Labs  04/12/13 0331 04/12/13 0812 04/12/13 1128  GLUCAP 96 119* 98   *RADIOLOGY REPORT*  Clinical Data: Post CABG  PORTABLE CHEST - 1 VIEW  Comparison: 04/11/2013; 04/08/2013  Findings: Interval extubation and removal of enteric tube. Stable  positioning of remaining support apparatus. Grossly unchanged  enlarged cardiac silhouette and mediastinal contours post median  sternotomy and CABG. Pulmonary vasculature remains indistinct with  cephalization of flow. Newly improved aeration of the bilateral  lung bases with persistent perihilar and bibasilar opacities, left  greater than right. Trace bilateral effusions are not excluded.  No pneumothorax. Unchanged bones.  IMPRESSION:  1. Interval extubation and removal of enteric tube. Otherwise,  stable positioning of remaining support apparatus. No  pneumothorax.  2. Minimally improved aeration of the lungs with persistent  findings of mild edema and bibasilar atelectasis.  Original Report Authenticated By: Tacey Ruiz, MD   Assessment/Plan: S/P Procedure(s) (LRB): CORONARY ARTERY BYPASS GRAFTING (CABG)  (N/A) INTRAOPERATIVE TRANSESOPHAGEAL ECHOCARDIOGRAM (N/A) Mobilize Diuresis Diabetes control d/c tubes/lines Continue foley due to patient in ICU and urinary output monitoring See progression orders   LOS: 4 days    BARTLE,BRYAN K 04/12/2013

## 2013-04-12 NOTE — Plan of Care (Signed)
Problem: Phase II Progression Outcomes Goal: Tolerates weaning with O2 Sat > 90 Outcome: Progressing On 4L. IS 250-500. Using CPAP at hs

## 2013-04-12 NOTE — Progress Notes (Signed)
Post OP Day 1  PROCEDURE: INTRAOPERATIVE TRANSESOPHAGEAL ECHOCARDIOGRAM, MEDIAN STERNOTOMY for  CORONARY ARTERY BYPASS GRAFTING (CABG) x3 (LIMA to LAD, SVG to DIAGONAL,a dn SVG to RAMUS INTERMEDIATE) using right thigh greater saphenous vein and left internal mammary After presenting with NSTEMI and found to have Severe 2 Vessel CAD with Ostial LAD & Ostial Ramus stenoses not PCI amenable. Moderately elevated LVEDP  Subjective: Pt extubated last pm, no specific complaints  Objective: Vital signs in last 24 hours: Temp:  [97.9 F (36.6 C)-100.6 F (38.1 C)] 98.6 F (37 C) (09/06 0700) Pulse Rate:  [80-112] 96 (09/06 0700) Resp:  [12-35] 17 (09/06 0700) BP: (79-148)/(55-91) 122/80 mmHg (09/06 0700) SpO2:  [92 %-100 %] 96 % (09/06 0700) Arterial Line BP: (91-169)/(53-97) 123/97 mmHg (09/06 0700) FiO2 (%):  [40 %-50 %] 40 % (09/05 1900) Weight:  [270 lb 1 oz (122.5 kg)] 270 lb 1 oz (122.5 kg) (09/06 0500) Weight change:  Last BM Date: 04/09/13 Intake/Output from previous day: +2381 09/05 0701 - 09/06 0700 In: 6027.3 [I.V.:4303.3; Blood:674; IV Piggyback:1050] Out: 3650 [Urine:3200; Chest Tube:450] Intake/Output this shift: Total I/O In: -  Out: 130 [Urine:110; Chest Tube:20]  PE: General:Pleasant affect, NAD Skin:Warm and dry, brisk capillary refill HEENT:normocephalic, sclera clear, mucus membranes moist Neck: no JVD  Heart:S1S2 RRR without murmur, gallup, ? rub no click Lungs:clear ant. With basal rales/ rhonchi, No wheezes ZOX:WRUE, non tender, + BS, do not palpate liver spleen or masses Ext: tr-1+ lower ext edema  Neuro:alert and oriented, MAE, follows commands, + facial symmetry   PAP  40/27 CO 7.1  CI 3  ST, EKG with ST elevation mild in ant leads  Lab Results:  Recent Labs  04/11/13 1900 04/11/13 1901 04/12/13 0441  WBC 12.1*  --  15.7*  HGB 13.1 12.2* 13.4  HCT 35.9* 36.0* 37.1*  PLT 132*  --  137*   BMET  Recent Labs  04/11/13 0528   04/11/13 1901 04/12/13 0441  NA 140  < > 141 139  K 3.6  < > 4.5 4.1  CL 104  --  106 104  CO2 25  --   --  26  GLUCOSE 108*  < > 111* 124*  BUN 13  --  15 18  CREATININE 1.05  < > 1.10 1.14  CALCIUM 8.7  --   --  8.0*  < > = values in this interval not displayed.  Recent Labs  04/09/13 1100  TROPONINI 0.35*    Lab Results  Component Value Date   CHOL 152 04/10/2013   HDL 34* 04/10/2013   LDLCALC 93 04/10/2013   TRIG 125 04/10/2013   CHOLHDL 4.5 04/10/2013   Lab Results  Component Value Date   HGBA1C 5.4 04/10/2013     No results found for this basename: TSH    Hepatic Function Panel  Recent Labs  04/10/13 1220  PROT 6.9  ALBUMIN 3.9  AST 40*  ALT 42  ALKPHOS 39  BILITOT 0.7    Recent Labs  04/10/13 0930  CHOL 152   No results found for this basename: PROTIME,  in the last 72 hours     Studies/Results: Dg Chest Portable 1 View  04/11/2013   *RADIOLOGY REPORT*  Clinical Data: Status post CABG.  PORTABLE CHEST - 1 VIEW  Comparison: Chest x-ray 04/08/2013.  Findings: An endotracheal tube is in place with tip 3.2 cm above the carina. A nasogastric tube is seen extending into the stomach, however, the tip of  the nasogastric tube extends below the lower margin of the image.  Left-sided chest tube in position with tip inside port projecting over the left hemithorax.  Right internal jugular central venous Cordis through which a Swan Ganz catheter has been passed into the pulmonic trunk.  Status post median sternotomy for CABG.  Lung volumes are low.  Bibasilar opacities (left greater than right), favored to reflect postoperative subsegmental atelectasis. No pneumothorax.  Small left pleural effusion. There is cephalization of the pulmonary vasculature and slight indistinctness of the interstitial markings suggestive of mild pulmonary edema.  Mild enlargement of the cardiopericardial silhouette.  Upper mediastinal contours appear mildly widened, but are within normal limits in a  postoperative patient.  IMPRESSION: 1.  Postoperative changes and support apparatus, as above. 2.  Bibasilar postoperative subsegmental atelectasis, small left pleural effusion and mild interstitial pulmonary edema.   Original Report Authenticated By: Trudie Reed, M.D.    Medications: I have reviewed the patient's current medications. Scheduled Meds: . acetaminophen  1,000 mg Oral Q6H   Or  . acetaminophen (TYLENOL) oral liquid 160 mg/5 mL  1,000 mg Per Tube Q6H  . aspirin EC  325 mg Oral Daily   Or  . aspirin  324 mg Per Tube Daily  . atorvastatin  80 mg Oral q1800  . bisacodyl  10 mg Oral Daily   Or  . bisacodyl  10 mg Rectal Daily  . cefUROXime (ZINACEF)  IV  1.5 g Intravenous Q12H  . Chlorhexidine Gluconate Cloth  6 each Topical Q0600  . docusate sodium  200 mg Oral Daily  . famotidine (PEPCID) IV  20 mg Intravenous Q12H  . fenofibrate  160 mg Oral Daily  . fluticasone  2 spray Each Nare Daily  . insulin aspart  0-24 Units Subcutaneous Q4H  . insulin regular  0-10 Units Intravenous TID WC  . metoCLOPramide (REGLAN) injection  10 mg Intravenous Q6H  . metoprolol tartrate  12.5 mg Oral BID   Or  . metoprolol tartrate  12.5 mg Per Tube BID  . montelukast  10 mg Oral QHS  . mupirocin ointment  1 application Nasal BID  . [START ON 04/13/2013] pantoprazole  40 mg Oral Daily  . sodium chloride  3 mL Intravenous Q12H   Continuous Infusions: . sodium chloride 20 mL/hr at 04/11/13 2300  . sodium chloride 20 mL/hr at 04/11/13 1300  . sodium chloride    . dexmedetomidine Stopped (04/11/13 1600)  . insulin (NOVOLIN-R) infusion Stopped (04/11/13 1600)  . lactated ringers 20 mL/hr at 04/11/13 1300  . nitroGLYCERIN 20 mcg/min (04/12/13 0700)  . 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 "Spider bite" rt forearm. Progressing.  LOS: 4 days   Time spent with pt. :15 minutes. [facility] R  Nurse Practitioner Certified Pager [phone] 04/12/2013, 8:17 AM  I have seen and evaluated the patient this AM along with  Nada Boozer, NP. I agree with her findings, examination as well as impression recommendations.  Steady progression POD #1 - extubated  last PM, did not have CPAP b/c connector not matching.  BP & HR stable - no pressors, on NTG gtt.  -- was only on low dose HCTZ @ home.  Had started Coreg on admission - on Metoprolol per post CABG orders --can titrate up. Would consider ACE-I/ARB instead of HCTZ to go along with BB. Basal rales/rhonchi -- IS, CPAP @ night.  Anticipate IV Lasix today per CVTS.  Anticipate CT +/- Ernestine Conrad out today --PAPs in ~40s-50s with significant respiratory variation. -- likely related to obesity / OSA @ baseline.  MD Time with pt: 5 min  Janiesha Diehl W, M.D., M.S. [facility] [address] [place], [place]  [address]  [phone] Pager # [phone] 04/12/2013 9:09 AM

## 2013-04-13 ENCOUNTER — Inpatient Hospital Stay (HOSPITAL_COMMUNITY): Payer: Medicare Other

## 2013-04-13 LAB — CBC
HCT: 36.9 % — ABNORMAL LOW (ref 39.0–52.0)
MCH: 31.9 pg (ref 26.0–34.0)
MCV: 88.5 fL (ref 78.0–100.0)
Platelets: 118 10*3/uL — ABNORMAL LOW (ref 150–400)
RDW: 13.1 % (ref 11.5–15.5)
WBC: 16.6 10*3/uL — ABNORMAL HIGH (ref 4.0–10.5)

## 2013-04-13 LAB — BASIC METABOLIC PANEL
CO2: 28 mEq/L (ref 19–32)
Calcium: 8.4 mg/dL (ref 8.4–10.5)
Chloride: 99 mEq/L (ref 96–112)
Creatinine, Ser: 0.96 mg/dL (ref 0.50–1.35)
Glucose, Bld: 114 mg/dL — ABNORMAL HIGH (ref 70–99)

## 2013-04-13 LAB — GLUCOSE, CAPILLARY: Glucose-Capillary: 117 mg/dL — ABNORMAL HIGH (ref 70–99)

## 2013-04-13 MED ORDER — ONDANSETRON HCL 4 MG/2ML IJ SOLN: 4.0000 mg | Freq: Four times a day (QID) | INTRAMUSCULAR | Status: DC | PRN

## 2013-04-13 MED ORDER — DOCUSATE SODIUM 100 MG PO CAPS
200.0000 mg | ORAL_CAPSULE | Freq: Every day | ORAL | Status: DC
  Administered 2013-04-14 – 2013-04-15 (×2): 200 mg via ORAL
  Filled 2013-04-13 (×2): qty 2

## 2013-04-13 MED ORDER — POTASSIUM CHLORIDE 10 MEQ/50ML IV SOLN
10.0000 meq | INTRAVENOUS | Status: AC | PRN
  Administered 2013-04-13 (×3): 10 meq via INTRAVENOUS

## 2013-04-13 MED ORDER — ONDANSETRON HCL 4 MG PO TABS: 4.0000 mg | ORAL_TABLET | Freq: Four times a day (QID) | ORAL | Status: DC | PRN

## 2013-04-13 MED ORDER — FUROSEMIDE 40 MG PO TABS
40.0000 mg | ORAL_TABLET | Freq: Every day | ORAL | Status: DC
  Administered 2013-04-14 – 2013-04-15 (×2): 40 mg via ORAL
  Filled 2013-04-13 (×2): qty 1

## 2013-04-13 MED ORDER — POTASSIUM CHLORIDE 10 MEQ/50ML IV SOLN
INTRAVENOUS | Status: AC
  Filled 2013-04-13: qty 150

## 2013-04-13 MED ORDER — SODIUM CHLORIDE 0.9 % IJ SOLN
3.0000 mL | Freq: Two times a day (BID) | INTRAMUSCULAR | Status: DC
  Administered 2013-04-14 – 2013-04-15 (×2): 3 mL via INTRAVENOUS

## 2013-04-13 MED ORDER — BISACODYL 10 MG RE SUPP: 10.0000 mg | Freq: Every day | RECTAL | Status: DC | PRN

## 2013-04-13 MED ORDER — METOPROLOL TARTRATE 25 MG PO TABS
25.0000 mg | ORAL_TABLET | Freq: Two times a day (BID) | ORAL | Status: DC
  Administered 2013-04-13 – 2013-04-15 (×4): 25 mg via ORAL
  Filled 2013-04-13 (×5): qty 1

## 2013-04-13 MED ORDER — MOVING RIGHT ALONG BOOK
Freq: Once | Status: DC
  Filled 2013-04-13: qty 1

## 2013-04-13 MED ORDER — POTASSIUM CHLORIDE CRYS ER 20 MEQ PO TBCR
20.0000 meq | EXTENDED_RELEASE_TABLET | Freq: Two times a day (BID) | ORAL | Status: AC
  Administered 2013-04-14 – 2013-04-15 (×3): 20 meq via ORAL
  Filled 2013-04-13 (×3): qty 1

## 2013-04-13 MED ORDER — ACETAMINOPHEN 325 MG PO TABS: 650.0000 mg | ORAL_TABLET | Freq: Four times a day (QID) | ORAL | Status: DC | PRN

## 2013-04-13 MED ORDER — SODIUM CHLORIDE 0.9 % IV SOLN: 250.0000 mL | INTRAVENOUS | Status: DC | PRN

## 2013-04-13 MED ORDER — OXYCODONE HCL 5 MG PO TABS: 5.0000 mg | ORAL_TABLET | ORAL | Status: DC | PRN

## 2013-04-13 MED ORDER — ASPIRIN EC 325 MG PO TBEC
325.0000 mg | DELAYED_RELEASE_TABLET | Freq: Every day | ORAL | Status: DC
  Administered 2013-04-14 – 2013-04-15 (×2): 325 mg via ORAL
  Filled 2013-04-13 (×3): qty 1

## 2013-04-13 MED ORDER — FAMOTIDINE 20 MG PO TABS
20.0000 mg | ORAL_TABLET | Freq: Two times a day (BID) | ORAL | Status: DC
  Administered 2013-04-13 – 2013-04-15 (×4): 20 mg via ORAL
  Filled 2013-04-13 (×6): qty 1

## 2013-04-13 MED ORDER — BISACODYL 5 MG PO TBEC: 10.0000 mg | DELAYED_RELEASE_TABLET | Freq: Every day | ORAL | Status: DC | PRN

## 2013-04-13 MED ORDER — TRAMADOL HCL 50 MG PO TABS
50.0000 mg | ORAL_TABLET | ORAL | Status: DC | PRN
  Administered 2013-04-13 – 2013-04-14 (×3): 100 mg via ORAL
  Administered 2013-04-15: 50 mg via ORAL
  Filled 2013-04-13 (×4): qty 2

## 2013-04-13 MED ORDER — SODIUM CHLORIDE 0.9 % IJ SOLN
3.0000 mL | INTRAMUSCULAR | Status: DC | PRN
  Administered 2013-04-14: 3 mL via INTRAVENOUS

## 2013-04-13 NOTE — Plan of Care (Signed)
Problem: Phase III Progression Outcomes Goal: O2 Sat remains > or equal to 90% on room air Outcome: Progressing Now on 2 L nasal cannula and ambulated 300 ft on 2 L Nasal cannula. Maintained sats 95% during ambulation. No rest stops. Goal: Advance to regular diet without nausea Outcome: Completed/Met Date Met:  04/13/13 Carb modified

## 2013-04-13 NOTE — Progress Notes (Signed)
Patient ambulated in hall at a steady pace with walker on 2L oxygen >600 ft. Tolerated well with no C/O SOB and no rest breaks. O2 sats after walk 99% on 2L. Will continue to monitor.

## 2013-04-13 NOTE — Progress Notes (Signed)
2 Days Post-Op Procedure(s) (LRB): CORONARY ARTERY BYPASS GRAFTING (CABG)  (N/A) INTRAOPERATIVE TRANSESOPHAGEAL ECHOCARDIOGRAM (N/A) Subjective: No complaints Walked around ICU twice today   Objective: Vital signs in last 24 hours: Temp:  [98.1 F (36.7 C)-98.8 F (37.1 C)] 98.1 F (36.7 C) (09/07 0824) Pulse Rate:  [86-104] 102 (09/07 1000) Cardiac Rhythm:  [-] Normal sinus rhythm (09/07 0800) Resp:  [14-29] 16 (09/07 1000) BP: (109-145)/(68-91) 109/90 mmHg (09/07 1012) SpO2:  [94 %-100 %] 97 % (09/07 1000) Arterial Line BP: (133-168)/(66-89) 168/76 mmHg (09/06 1800) Weight:  [119.3 kg (263 lb 0.1 oz)] 119.3 kg (263 lb 0.1 oz) (09/07 0500)  Hemodynamic parameters for last 24 hours: PAP: (51-58)/(13-34) 51/34 mmHg  Intake/Output from previous day: 09/06 0701 - 09/07 0700 In: 1293 [P.O.:610; I.V.:583; IV Piggyback:100] Out: 2780 [Urine:2600; Chest Tube:180] Intake/Output this shift: Total I/O In: 303 [P.O.:240; I.V.:63] Out: 300 [Urine:300]  General appearance: alert and cooperative Heart: regular rate and rhythm, S1, S2 normal, no murmur, click, rub or gallop Lungs: clear to auscultation bilaterally Wound: dressing dry  Lab Results:  Recent Labs  04/12/13 1730 04/12/13 1742 04/13/13 0416  WBC 16.9*  --  16.6*  HGB 13.8 13.6 13.3  HCT 37.9* 40.0 36.9*  PLT 133*  --  118*   BMET:  Recent Labs  04/12/13 0441  04/12/13 1742 04/13/13 0416  NA 139  --  140 136  K 4.1  --  3.9 3.7  CL 104  --  102 99  CO2 26  --   --  28  GLUCOSE 124*  --  119* 114*  BUN 18  --  18 20  CREATININE 1.14  < > 1.20 0.96  CALCIUM 8.0*  --   --  8.4  < > = values in this interval not displayed.  PT/INR:  Recent Labs  04/11/13 1311  LABPROT 16.8*  INR 1.40   ABG    Component Value Date/Time   PHART 7.368 04/11/2013 2211   HCO3 24.4* 04/11/2013 2211   TCO2 27 04/12/2013 1742   ACIDBASEDEF 1.0 04/11/2013 2211   O2SAT 95.0 04/11/2013 2211   CBG (last 3)   Recent Labs  04/12/13 2318 04/13/13 0319 04/13/13 0820  GLUCAP 98 115* 117*   *RADIOLOGY REPORT*  Clinical Data: Status post cardiac surgery.  PORTABLE CHEST - 1 VIEW  Comparison: Chest x-ray 04/12/2013.  Findings: Previously noted left-sided chest tube has been removed.  No definite left-sided pneumothorax identified at this time.  Previously noted Swan-Ganz catheter has also been removed, although  the right IJ central venous Cordis remains in position with tip in  the proximal superior vena cava. Lung volumes are low. Minimal  bibasilar opacities are most compatible with areas of resolving  postoperative subsegmental atelectasis. No appreciable pleural  effusions. Mild pulmonary venous congestion, without frank  pulmonary edema. Heart size appears borderline enlarged.  Mediastinal contours are within normal limits for postoperative  patient. Status post median sternotomy for CABG.  IMPRESSION:  1. Support apparatus and postoperative changes, as above.  2. No definite pneumothorax following removal of a left-sided  chest tube.  3. Resolving bibasilar subsegmental atelectasis.  4. Mild pulmonary venous congestion without frank pulmonary edema.  Original Report Authenticated By: Trudie Reed, M.D.   Assessment/Plan: S/P Procedure(s) (LRB): CORONARY ARTERY BYPASS GRAFTING (CABG)  (N/A) INTRAOPERATIVE TRANSESOPHAGEAL ECHOCARDIOGRAM (N/A) Mobilize Plan for transfer to step-down: see transfer orders   LOS: 5 days    BARTLE,BRYAN K 04/13/2013

## 2013-04-13 NOTE — Plan of Care (Signed)
Problem: Phase III Progression Outcomes Goal: Time patient transferred to PCTU/Telemetry POD Outcome: Completed/Met Date Met:  04/13/13 1545. Transferred via wheelchair with monitor and on 2 L nasal O2. To 2W25 after report given to [facility], [place]. Tolerated transfer well.On cardiac surgery pathway.

## 2013-04-14 ENCOUNTER — Encounter (HOSPITAL_COMMUNITY): Payer: Self-pay | Admitting: Cardiothoracic Surgery

## 2013-04-14 DIAGNOSIS — G4730 Sleep apnea, unspecified: Secondary | ICD-10-CM | POA: Diagnosis present

## 2013-04-14 DIAGNOSIS — Z8709 Personal history of other diseases of the respiratory system: Secondary | ICD-10-CM | POA: Diagnosis present

## 2013-04-14 DIAGNOSIS — G4733 Obstructive sleep apnea (adult) (pediatric): Secondary | ICD-10-CM | POA: Diagnosis present

## 2013-04-14 MED ORDER — LISINOPRIL 5 MG PO TABS
5.0000 mg | ORAL_TABLET | Freq: Every day | ORAL | Status: DC
  Administered 2013-04-14: 5 mg via ORAL
  Filled 2013-04-14 (×2): qty 1

## 2013-04-14 MED FILL — Dexmedetomidine HCl IV Soln 200 MCG/2ML: INTRAVENOUS | Qty: 2 | Status: AC

## 2013-04-14 MED FILL — Magnesium Sulfate Inj 50%: INTRAMUSCULAR | Qty: 10 | Status: AC

## 2013-04-14 MED FILL — Sodium Chloride IV Soln 0.9%: INTRAVENOUS | Qty: 1000 | Status: AC

## 2013-04-14 MED FILL — Potassium Chloride Inj 2 mEq/ML: INTRAVENOUS | Qty: 40 | Status: AC

## 2013-04-14 MED FILL — Mannitol IV Soln 20%: INTRAVENOUS | Qty: 500 | Status: AC

## 2013-04-14 MED FILL — Heparin Sodium (Porcine) Inj 1000 Unit/ML: INTRAMUSCULAR | Qty: 30 | Status: AC

## 2013-04-14 MED FILL — Electrolyte-R (PH 7.4) Solution: INTRAVENOUS | Qty: 4000 | Status: AC

## 2013-04-14 MED FILL — Lidocaine HCl IV Inj 20 MG/ML: INTRAVENOUS | Qty: 5 | Status: AC

## 2013-04-14 MED FILL — Sodium Chloride Irrigation Soln 0.9%: Qty: 3000 | Status: AC

## 2013-04-14 MED FILL — Sodium Bicarbonate IV Soln 8.4%: INTRAVENOUS | Qty: 50 | Status: AC

## 2013-04-14 NOTE — Progress Notes (Signed)
CARDIAC REHAB PHASE I   PRE:  Rate/Rhythm: 100 ST    BP: sitting 124/80    SaO2: 93 RA  MODE:  Ambulation: 690 ft   POST:  Rate/Rhythm: 108 ST    BP: sitting 137/80     SaO2: 96 RA  Tolerated well. To BR then walked with RW. Steady. No O2 needed. Return to recliner.  [id number]   Elissa Lovett Conway CES, ACSM 04/14/2013 10:55 AM

## 2013-04-14 NOTE — Discharge Summary (Signed)
Physician Discharge Summary  Patient ID: David Hogan MRN: [id number] DOB/AGE: 1945-08-10 67 y.o.  Admit date: 04/08/2013 Discharge date: 04/15/2013  Admission Diagnoses:  Patient Active Problem List   Diagnosis Date Noted  . Sleep apnea- on C-pap 04/14/2013  . Asthma 04/14/2013  . 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 Spider bite wound, rt forearm 04/12/2013  . NSTEMI (non-ST elevated myocardial infarction) 04/09/2013  . Sinus tachycardia 04/09/2013  . Obesity, unspecified 04/09/2013  . NSVT - NL LVF by echo 04/09/2013  . Syncope - on admission after bee sting.  04/09/2013   Discharge Diagnoses:   Patient Active Problem List   Diagnosis Date Noted  . Sleep apnea- on C-pap 04/14/2013  . Asthma 04/14/2013  . 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 S/P CABG x 3, LIMA-LAD; VG-Diag; VG-ramus intermediate 04/11/13 04/12/2013  . Spider bite wound, rt forearm 04/12/2013  . NSTEMI (non-ST elevated myocardial infarction) 04/09/2013  . Sinus tachycardia 04/09/2013  . Obesity, unspecified 04/09/2013  . NSVT - NL LVF by echo 04/09/2013  . Syncope - on admission after bee sting.  04/09/2013   Discharged Condition: good  History of Present Illness:   Mr. David Hogan is a 67 year old obese Caucasian male who was in his usual state of health until the afternoon of 04/08/2013. He was over at his sister's house helping his sister move. He picked up a container that was filled with yellow jackets and got stung multiple times. He then ran into the house. He became diaphoretic and had some shortness of breath. He went and sat down. He got something to drink and then felt nauseated. His family noticed he was not responsive so they placed him on the floor. Apparently, his face was red, he was breathing, and had a pulse. EMS was called.  He denied any palpitations or chest pain. He also denied any difficulty swallowing or itching.  Evaluation in the ED  revealed he was tachycardic at 140 bpm with ST depression in the anterolateral precordial leads. The ST depression resolved when the tachycardia resolved. He was only given Zofran for nausea and benadryl and solumedrol.  Troponin I was 0.56 and he ruled in for a NSTEMI.  Hospital Course:   Upon admission to the hospital the patient was taken for cardiac catheterization.  This revealed severe 2 vessel CAD which was not felt to be amenable to PCI.  It was felt bypass surgery would be indicated.  [facility] was consulted and the patient was evaluated by Dr. Tyrone Sage.  He was in agreement that surgical revascularization would be the best treatment option.  The risks and benefits of the procedure were explained to the patient and the patient was agreeable to proceed.  He was taken to the operating room on 04/11/2013.  He underwent CABG x 3 utilizing LIMA to LAD, SVG to Diagonal, and SVG to Ramus Intermediate.  He also underwent EVH of his right thigh.  The patient tolerated the procedure well and was taken to the SICU in stable condition.  The patient was extubated the evening of surgery.  During his stay in the ICU his chest tubes and lines were removed without difficulty.  He was ambulating without assistance and was transferred to the telemetry unit in stable condition.  The patient has continued to progress.  He is maintaining NSR and his pacing wires have been removed.  He was started on ACE I for blood pressure  control and post MI care.  He has been weaned off his oxygen.  The patient is tolerating a  diet.  Should no further issues arise we anticipate discharge home in the next 24-48 hours.  He will follow up at [facility] on 05/05/2013 with a CXR prior to his appointment.  He will also need to follow up with Dr. Elissa Hefty office.    Significant Diagnostic Studies: angiography:  Access: Right Radial Artery; 6 Fr Sheath -- Seldinger technique (Angiocath Micropuncture Kit)  Radial Cocktail - 10 mL IA, IV Heparin 5000 units   Diagnostic: 5 Fr TIG 4.0 catheter advanced and exchanged over long exchange safety J. wire  Left Coronary Artery Angiography:5 Fr TIG 4.0, 6 Fr XB LAD 3.5  Right Coronary Artery Angiography: JR 4, unsuccessful, but unable to pass No Torque catheter; EZ Rad Right  LV Hemodynamics (LV Gram): JR 4  TR Band: 1425 Hours, 14 mL air  MEDICATIONS:  Anesthesia: Local Lidocaine to ml Sedation: 1 mg IV Versed, 25 mcg IV fentanyl ;  Omnipaque Contrast: 180 ml, several additional views were taken with the guide catheter and for the LAD/ramus visualization.  Anticoagulation: IV Heparin 5000 Units  Anti-Platelet Agent: ASA 81 mg Hemodynamics:  Central Aortic / Mean Pressures: 135/82 mmHg; 102 mmHg  Left Ventricular Pressures / EDP: 138/12 mmHg; 25 mmHg Left Ventriculography: Not done - Echo just performed.  Coronary Anatomy:  Left Main: Very large caliber vessel that trifurcates into the LAD, Ramus Intermedius, and codominant Circumflex; there is mild distal narrowing, but otherwise angiographically normal.  LAD: Large-caliber vessel with focal ostial 80% followed by 70% proximal stenosis. The vessel then gives off a septal perforator and a first diagonal branch. It then wraps down around the apex with no further significant disease.  D1: Very proximal branch. It bifurcates into larger and smaller branches. The larger branches a moderate caliber vessel with a 60-70% lesion just at the bifurcation.  Left Circumflex: Very large caliber, codominant vessel. Gives rise to several small obtuse marginal branches before it bifurcates into a large lateral obtuse marginal 1 in the left posterior lateral system with a small AV groove continuation. Minimal to no angiographic evidence of CAD in the entire circumflex system.  OM1: This is a large lateral branch that bifurcates distally around the inferolateral border.  Left Posterior Lateral System: This followed branch of the circumflex is as large as a lateral OM. It  gives rise to several small posterior lateral branches, at least 3.  Ramus intermedius: Moderate to large caliber vessel that bifurcates in the mid vessel covering a large portion of the anterolateral wall which would be the first obtuse marginal location. There is ostial and proximal 99% stenosis.  RCA: Large caliber, codominant vessel with a downward takeoff. There is mild ostial 20% stenosis. The vessel terminates as a large posterior descending artery became the apex. There is a diminutive Right Posterior AV Groove Branch (RPAV). Angiographically normal.    Treatments: surgery:   CORONARY ARTERY BYPASS GRAFTING (CABG) x3 (LIMA to LAD, SVG to DIAGONAL,a dn SVG to RAMUS INTERMEDIATE) using right thigh greater saphenous vein and left internal mammary.  Disposition: Home  Discharge medications:   Medication List    STOP taking these medications       hydrochlorothiazide 25 MG tablet  Commonly known as:  HYDRODIURIL      TAKE these medications       albuterol 108 (90 BASE) MCG/ACT inhaler  Commonly known as:  PROVENTIL HFA;VENTOLIN HFA  Inhale 2 puffs into the lungs every 6 (six) hours as needed for wheezing or shortness of breath.     albuterol (2.5 MG/3ML) 0.083% nebulizer solution  Commonly known as:  PROVENTIL  Take 2.5 mg by nebulization every 6 (six) hours as needed for wheezing or shortness of breath.     aspirin 325 MG EC tablet  Take 1 tablet (325 mg total) by mouth daily.     atorvastatin 80 MG tablet  Commonly known as:  LIPITOR  Take 1 tablet (80 mg total) by mouth daily at 6 PM.     fenofibrate 160 MG tablet  Take 160 mg by mouth daily.     fluticasone 50 MCG/ACT nasal spray  Commonly known as:  FLONASE  Place 2 sprays into the nose daily.     furosemide 40 MG tablet  Commonly known as:  LASIX  Take 1 tablet (40 mg total) by mouth daily. For 5 days then stop.     lisinopril 10 MG tablet  Commonly known as:  PRINIVIL,ZESTRIL  Take 1 tablet (10 mg total)  by mouth daily.     metoprolol tartrate 25 MG tablet  Commonly known as:  LOPRESSOR  Take 1 tablet (25 mg total) by mouth 2 (two) times daily.     montelukast 10 MG tablet  Commonly known as:  SINGULAIR  Take 10 mg by mouth every morning.     oxyCODONE 5 MG immediate release tablet  Commonly known as:  Oxy IR/ROXICODONE  Take 1-2 tablets (5-10 mg total) by mouth every 4 (four) hours as needed for pain.     potassium chloride SA 20 MEQ tablet  Commonly known as:  K-DUR,KLOR-CON  Take 1 tablet (20 mEq total) by mouth daily. For 5 days then stop.       The patient has been discharged on:   1.Beta Blocker:  Yes [ x  ]                              No   [   ]                              If No, reason:  2.Ace Inhibitor/ARB: Yes [ x  ]                                     No  [    ]                                     If No, reason:  3.Statin:   Yes [ x  ]                  No  [   ]                  If No, reason:  4.Ecasa:  Yes  [  x ]                  No   [   ]                  If No, reason:    Signed: Eretria Manternach M 04/15/2013, 8:03 AM

## 2013-04-14 NOTE — Progress Notes (Signed)
Subjective:  Up with PT  Objective:  Vital Signs in the last 24 hours: Temp:  [97.6 F (36.4 C)-99.4 F (37.4 C)] 97.9 F (36.6 C) (09/08 0611) Pulse Rate:  [89-109] 97 (09/08 0611) Resp:  [14-20] 20 (09/08 0611) BP: (109-142)/(57-90) 128/74 mmHg (09/08 0611) SpO2:  [97 %-100 %] 98 % (09/08 0611) Weight:  [260 lb 12.9 oz (118.3 kg)] 260 lb 12.9 oz (118.3 kg) (09/08 0611)  Intake/Output from previous day:  Intake/Output Summary (Last 24 hours) at 04/14/13 0939 Last data filed at 04/14/13 4098  Gross per 24 hour  Intake     63 ml  Output    875 ml  Net   -812 ml    Physical Exam: General appearance: alert, cooperative, no distress and moderately obese Lungs: clear to auscultation bilaterally Heart: regular rate and rhythm   Rate: 90  Rhythm: normal sinus rhythm  Lab Results:  Recent Labs  04/12/13 1730 04/12/13 1742 04/13/13 0416  WBC 16.9*  --  16.6*  HGB 13.8 13.6 13.3  PLT 133*  --  118*    Recent Labs  04/12/13 0441  04/12/13 1742 04/13/13 0416  NA 139  --  140 136  K 4.1  --  3.9 3.7  CL 104  --  102 99  CO2 26  --   --  28  GLUCOSE 124*  --  119* 114*  BUN 18  --  18 20  CREATININE 1.14  < > 1.20 0.96  < > = values in this interval not displayed. No results found for this basename: TROPONINI, CK, MB,  in the last 72 hours  Recent Labs  04/11/13 1311  INR 1.40    Imaging: Imaging results have been reviewed  Cardiac Studies: 2D- 04/09/13- - Left ventricle: The cavity size was normal. There was severe concentric hypertrophy. Systolic function was vigorous. The estimated ejection fraction was in the range of 65% to 70%. Wall motion was normal; there were no regional wall motion abnormalities. Doppler parameters are consistent with abnormal left ventricular relaxation (grade 1 diastolic dysfunction). The E/e' ratio is <10, suggesting normal LV filling pressure. - Aortic valve: Sclerosis without stenosis. Transvalvular velocity was minimally  increased, owing to an increased LVOT velocity of 10 mmHg. No regurgitation. - Inferior vena cava: The vessel was normal in size; the respirophasic diameter changes were in the normal range (= 50%); findings are consistent with normal central venous pressure.   Assessment/Plan:   Principal Problem:   NSTEMI (non-ST elevated myocardial infarction) Active Problems:   CAD, multiple vessel not amenable to PCI at cath 04/09/13   S/P CABG x 3, LIMA-LAD; VG-Diag; VG-ramus intermediate 04/11/13   NSVT - NL LVF by echo   Syncope - on admission after bee sting.    Obesity, unspecified   Sleep apnea- on C-pap   Asthma   Spider bite wound, rt forearm    PLAN: Resume C-pap after discharge. Follow up with Dr Herbie Baltimore after discharge.  His primary care MD is in [place].  Corine Shelter PA-C Beeper [phone] 04/14/2013, 9:39 AM   Agree with note written by Corine Shelter [facility]  Looks great !!! POD #3 CABG X 2. Exam benign. Ambulating w/o difficulty. NSR. Labs OK. Nl progression per [facility].   Runell Gess 04/14/2013 10:51 AM

## 2013-04-14 NOTE — Progress Notes (Addendum)
      [address]       [place] [address]             [phone]        3 Days Post-Op Procedure(s) (LRB): CORONARY ARTERY BYPASS GRAFTING (CABG)  (N/A) INTRAOPERATIVE TRANSESOPHAGEAL ECHOCARDIOGRAM (N/A)  Subjective: Patient without specific complaints this am  Objective: Vital signs in last 24 hours: Temp:  [97.6 F (36.4 C)-99.4 F (37.4 C)] 97.9 F (36.6 C) (09/08 0611) Pulse Rate:  [89-109] 97 (09/08 0611) Cardiac Rhythm:  [-] Normal sinus rhythm;Sinus tachycardia (09/07 2000) Resp:  [14-20] 20 (09/08 0611) BP: (109-142)/(57-90) 128/74 mmHg (09/08 0611) SpO2:  [97 %-100 %] 98 % (09/08 0611) Weight:  [118.3 kg (260 lb 12.9 oz)] 118.3 kg (260 lb 12.9 oz) (09/08 0611)  Pre op weight  120 kg Current Weight  04/14/13 118.3 kg (260 lb 12.9 oz)      Intake/Output from previous day: 09/07 0701 - 09/08 0700 In: 343 [P.O.:240; I.V.:103] Out: 1100 [Urine:1100]   Physical Exam:  Cardiovascular: RRR, no murmurs, gallops, or rubs. Pulmonary: Diminished at bases; no rales, wheezes, or rhonchi. Abdomen: Soft, non tender, bowel sounds present. Extremities: Mild bilateral lower extremity edema. Wounds: Clean and dry.  No erythema or signs of infection.  Lab Results: CBC: Recent Labs  04/12/13 1730 04/12/13 1742 04/13/13 0416  WBC 16.9*  --  16.6*  HGB 13.8 13.6 13.3  HCT 37.9* 40.0 36.9*  PLT 133*  --  118*   BMET:  Recent Labs  04/12/13 0441  04/12/13 1742 04/13/13 0416  NA 139  --  140 136  K 4.1  --  3.9 3.7  CL 104  --  102 99  CO2 26  --   --  28  GLUCOSE 124*  --  119* 114*  BUN 18  --  18 20  CREATININE 1.14  < > 1.20 0.96  CALCIUM 8.0*  --   --  8.4  < > = values in this interval not displayed.  PT/INR:  Lab Results  Component Value Date   INR 1.40 04/11/2013   INR 1.25 04/10/2013   INR 1.26 04/09/2013   ABG:  INR: Will add last result for INR, ABG once components are confirmed Will add last 4 CBG results once components  are confirmed  Assessment/Plan:  1. CV - SR. On Lopressor 25 bid. S/P NSTEMI. Will start low dose ACE. 2.  Pulmonary - On 2 liters of oxygen via [place]. Wean as tolerates. Encourage incentive spirometer 3. Volume Overload - Continue with Lasix daily 4.Thrombocytopenia-platelets 118,000. 5.Remove EPW in am 6.Likely discharge Wednesday.  ZIMMERMAN,DONIELLE MPA-C 04/14/2013,7:54 AM  I have seen and examined Irven Easterly and agree with the above assessment  and plan.  Delight Ovens MD Beeper [phone] Office [phone] 04/14/2013 3:12 PM

## 2013-04-15 DIAGNOSIS — G4730 Sleep apnea, unspecified: Secondary | ICD-10-CM

## 2013-04-15 DIAGNOSIS — I498 Other specified cardiac arrhythmias: Secondary | ICD-10-CM

## 2013-04-15 LAB — CBC
HCT: 35.6 % — ABNORMAL LOW (ref 39.0–52.0)
MCH: 31.6 pg (ref 26.0–34.0)
MCHC: 35.7 g/dL (ref 30.0–36.0)
MCV: 88.6 fL (ref 78.0–100.0)
RDW: 13.2 % (ref 11.5–15.5)
WBC: 9.5 10*3/uL (ref 4.0–10.5)

## 2013-04-15 MED ORDER — ASPIRIN 325 MG PO TBEC: 325.0000 mg | DELAYED_RELEASE_TABLET | Freq: Every day | ORAL | Status: DC

## 2013-04-15 MED ORDER — LISINOPRIL 10 MG PO TABS
10.0000 mg | ORAL_TABLET | Freq: Every day | ORAL | Status: DC
  Administered 2013-04-15: 10 mg via ORAL
  Filled 2013-04-15: qty 1

## 2013-04-15 MED ORDER — LISINOPRIL 10 MG PO TABS: 10.0000 mg | ORAL_TABLET | Freq: Every day | ORAL | Status: DC

## 2013-04-15 MED ORDER — METOPROLOL TARTRATE 25 MG PO TABS: 25.0000 mg | ORAL_TABLET | Freq: Two times a day (BID) | ORAL | Status: DC

## 2013-04-15 MED ORDER — POTASSIUM CHLORIDE CRYS ER 20 MEQ PO TBCR: 20.0000 meq | EXTENDED_RELEASE_TABLET | Freq: Every day | ORAL | Status: DC

## 2013-04-15 MED ORDER — ATORVASTATIN CALCIUM 80 MG PO TABS: 80.0000 mg | ORAL_TABLET | Freq: Every day | ORAL | Status: DC

## 2013-04-15 MED ORDER — FUROSEMIDE 40 MG PO TABS: 40.0000 mg | ORAL_TABLET | Freq: Every day | ORAL | Status: DC

## 2013-04-15 MED ORDER — OXYCODONE HCL 5 MG PO TABS: 5.0000 mg | ORAL_TABLET | ORAL | Status: DC | PRN

## 2013-04-15 NOTE — Op Note (Signed)
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  POSTOPERATIVE DIAGNOSIS:  Coronary occlusive disease with recent non-ST elevation myocardial infarction.  SURGICAL PROCEDURE:  Coronary artery bypass grafting x3 with left internal mammary to the left anterior descending coronary artery, reverse saphenous vein graft to the diagonal coronary artery, reverse saphenous vein graft to the intermediate coronary artery with right thigh endo vein harvesting.  SURGEON:  Sheliah Plane, MD  FIRST ASSISTANT:  Doree Fudge, PA  BRIEF HISTORY:  The patient is a 67 year old male with no previous known history of coronary artery disease, who presented with syncopal episode after being stung by multiple yellow jackets.  He was evaluated by Dr. Herbie Baltimore because of positive troponin and nonsustained VT while initially seen.  It was decided to proceed with cardiac catheterization.  At the time of catheterization, he was found to have preserved LV function with high grade ostial LAD disease and high-grade disease in diagonal and intermediate.  The circumflex and right coronary arteries had luminal irregularities but no significant stenosis.  Because of the complexity of the proximal LAD lesion, it was not felt that angioplasty was appropriate and patient was referred for coronary artery bypass grafting.  Risks and options were discussed with the patient in detail and he was willing to proceed and signed informed consent.  DESCRIPTION OF PROCEDURE:  With Swan-Ganz and arterial line monitors in place, the patient underwent general  endotracheal anesthesia without incident.  Skin of the chest and legs was prepped with Betadine and draped in usual sterile manner.  Using the Guidant endo vein harvesting system, segment of vein was harvested from the right thigh.  Median sternotomy was performed.  Left internal mammary artery was dissected down as a pedicle graft.  The distal artery was divided and had good free flow.  Pericardium was opened.  Overall, ventricular function was preserved.  The patient was systemically heparinized.  Ascending aorta was cannulated.  The right atrium was cannulated and aortic root vent cardioplegia needle was introduced into the ascending aorta.  The patient was placed on cardiopulmonary bypass 2.4 L/min/m2.  Sites of anastomosis were selected and dissected out of the epicardium.  The patient's body temperature was cooled to 32 degrees.  Aortic crossclamp was applied and 500 mL of cold blood potassium cardioplegia was administered with diastolic arrest of the heart.  Myocardial septal temperature was monitored throughout the crossclamp period.  Attention was turned first to the intermediate coronary artery, which was partially intramyocardial.  The vessel was opened and was approximately 1.5 mm in size.  Using a running 7-0 Prolene, distal anastomosis was performed.  The diagonal coronary artery was a slightly smaller vessel. It was open and was approximately 1.3 mm in size.  Using a running 7-0 Prolene, distal anastomosis was performed.  Attention was then turned to  the mid left anterior descending coronary artery which was open.  This was a large vessel measuring approximately 1.8-2 mm in size.  Using a running 8-0 Prolene, left internal mammary artery was anastomosed to the left anterior descending coronary artery.  With release of the bulldog on the mammary artery, there was appropriate rise in myocardial septal temperature.  Bulldog was placed back on the mammary artery.   With crossclamp still in place, two punch aortotomies were performed and each of the two vein grafts were anastomosed to the ascending aorta.  Air was evacuated from the grafts and the aortic crossclamp was removed with a total crossclamp time of 69 minutes.  The patient spontaneously converted to a sinus rhythm.  Sites of anastomosis were inspected and free of bleeding.  He was then ventilated and weaned from cardiopulmonary bypass without difficulty.  He remained hemodynamically stable.  He was decannulated in usual fashion.  Protamine sulfate was administered with operative field hemostatic.  Atrial and ventricular pacing wires were applied.  Graft marker was applied.  Left pleural tube and Blake mediastinal drain were left in place.  Pericardium was loosely reapproximated.  Sternum was closed with #6 stainless steel wire. Fascia was closed with interrupted 0 Vicryl, running 3-0 Vicryl in subcutaneous tissue, and 3-0 subcuticular stitch in skin edges.  Dry dressings were applied.  Sponge and needle count was reported as correct at completion of procedure.  The patient did not require any blood products during the procedure.  Total pump time was 95 minutes.     Sheliah Plane, MD     EG/MEDQ  D:  04/14/2013  T:  04/14/2013  Job:  [id number]

## 2013-04-15 NOTE — Progress Notes (Signed)
Subjective:  Has been walking -- ready to go home.  Objective:  Vital Signs in the last 24 hours: Temp:  [98 F (36.7 C)-98.2 F (36.8 C)] 98.2 F (36.8 C) (09/09 0525) Pulse Rate:  [97-106] 97 (09/09 0525) Resp:  [18-20] 20 (09/09 0525) BP: (137-143)/(74-89) 143/89 mmHg (09/09 0525) SpO2:  [96 %-97 %] 97 % (09/09 0525) Weight:  [256 lb 12.8 oz (116.484 kg)] 256 lb 12.8 oz (116.484 kg) (09/09 0525)  Intake/Output from previous day:  Intake/Output Summary (Last 24 hours) at 04/15/13 0907 Last data filed at 04/15/13 0802  Gross per 24 hour  Intake    480 ml  Output    300 ml  Net    180 ml    Physical Exam: General appearance: alert, cooperative, no distress and moderately obese Lungs: clear to auscultation bilaterally Heart: regular rate and rhythm, no M/R/G Abd: soft/ NT / Nd NABS   Rate: 90s  Rhythm: normal sinus rhythm  Lab Results:  Recent Labs  04/13/13 0416 04/15/13 0517  WBC 16.6* 9.5  HGB 13.3 12.7*  PLT 118* 186    Recent Labs  04/12/13 1742 04/13/13 0416  NA 140 136  K 3.9 3.7  CL 102 99  CO2  --  28  GLUCOSE 119* 114*  BUN 18 20  CREATININE 1.20 0.96   Medications Reviewed  Imaging: Imaging results have been reviewed  Cardiac Studies: no new   Assessment/Plan:   Principal Problem:   NSTEMI (non-ST elevated myocardial infarction) Active Problems:   NSVT - NL LVF by echo   Syncope - on admission after bee sting.    Obesity, unspecified   CAD, multiple vessel not amenable to PCI at cath 04/09/13   S/P CABG x 3, LIMA-LAD; VG-Diag; VG-ramus intermediate 04/11/13   Spider bite wound, rt forearm   Sleep apnea- on C-pap   Asthma   He is doing well POD#4.   BP & HR are stable -- a bit sore, but now Angina or CP.  No arrhythmia  PLAN:   Anticipate d/c today. Will need CPAP on d/c No CHF - so I doubt he will need a diuretic long term.  ASA, Statin added this hospitalization - can reduce ASA to 81 mg.  BP is still a bit elevated -  will probably be able to increase BB dose to 50 bid at initial post-hospital visit. Is on stable ACE-I dose that would be #2 to up-titrate.  He will f/u with Mr. Idelle Jo in ~1-2 weeks, then me in ~1 month. His primary care MD is in [place].   Deagen Krass W 04/15/2013 9:07 AM

## 2013-04-15 NOTE — Progress Notes (Addendum)
      [address]       [place] [address]             [phone]        4 Days Post-Op Procedure(s) (LRB): CORONARY ARTERY BYPASS GRAFTING (CABG)  (N/A) INTRAOPERATIVE TRANSESOPHAGEAL ECHOCARDIOGRAM (N/A)  Subjective: Patient without specific complaints this am. He wants to go home.  Objective: Vital signs in last 24 hours: Temp:  [98 F (36.7 C)-98.2 F (36.8 C)] 98.2 F (36.8 C) (09/09 0525) Pulse Rate:  [97-106] 97 (09/09 0525) Cardiac Rhythm:  [-] Normal sinus rhythm (09/08 2054) Resp:  [18-20] 20 (09/09 0525) BP: (137-143)/(74-89) 143/89 mmHg (09/09 0525) SpO2:  [96 %-97 %] 97 % (09/09 0525) Weight:  [116.484 kg (256 lb 12.8 oz)] 116.484 kg (256 lb 12.8 oz) (09/09 0525)  Pre op weight  120 kg Current Weight  04/15/13 116.484 kg (256 lb 12.8 oz)      Intake/Output from previous day: 09/08 0701 - 09/09 0700 In: 240 [P.O.:240] Out: 300 [Urine:300]   Physical Exam:  Cardiovascular: RRR, no murmurs, gallops, or rubs. Pulmonary: Diminished at bases; no rales, wheezes, or rhonchi. Abdomen: Soft, non tender, bowel sounds present. Extremities: Mild bilateral lower extremity edema. Wounds: Clean and dry.  No erythema or signs of infection.  Lab Results: CBC:  Recent Labs  04/13/13 0416 04/15/13 0517  WBC 16.6* 9.5  HGB 13.3 12.7*  HCT 36.9* 35.6*  PLT 118* 186   BMET:   Recent Labs  04/12/13 1742 04/13/13 0416  NA 140 136  K 3.9 3.7  CL 102 99  CO2  --  28  GLUCOSE 119* 114*  BUN 18 20  CREATININE 1.20 0.96  CALCIUM  --  8.4    PT/INR:  Lab Results  Component Value Date   INR 1.40 04/11/2013   INR 1.25 04/10/2013   INR 1.26 04/09/2013   ABG:  INR: Will add last result for INR, ABG once components are confirmed Will add last 4 CBG results once components are confirmed  Assessment/Plan:  1. CV - S/P NSTEMI. SR. On Lopressor 25 bid and Lisinopril 5 daily. Will increase Lisinopril to 10 daily for better bp control 2.   Pulmonary - Was on 2 liters of oxygen via [place] but has been weaned to room air. Encourage incentive spirometer 3. Volume Overload - Continue with Lasix daily 4.Thrombocytopenia resolved-platelets up to 186,000. 5.Remove EPW and sutures 6.Mild ABL anemia-H and H 12.7 and 35.6 7.Discharge home  ZIMMERMAN,DONIELLE MPA-C 04/15/2013,7:46 AM  Plan home today I have seen and examined David Hogan and agree with the above assessment  and plan.  Delight Ovens MD Beeper [phone] Office [phone] 04/15/2013 2:02 PM

## 2013-04-15 NOTE — Progress Notes (Signed)
Assessment unchanged. Discussed D/C instructions with pt and wife including new Rx and f/u appointments. Verbalized understanding. RX and appointment card given to pt. IV and tele removed. Pt left via W/C accompanied by RN.

## 2013-04-15 NOTE — Care Management Note (Signed)
    Page 1 of 1   04/15/2013     4:50:47 PM   CARE MANAGEMENT NOTE 04/15/2013  Patient:  ZAYD, BONET   Account Number:  [id number]  Date Initiated:  04/14/2013  Documentation initiated by:  Mikiya Nebergall  Subjective/Objective Assessment:   PT S/P CABG X 3 ON 04/11/13.  PTA, PT INDEPENDENT, LIVES WITH SPOUSE.     Action/Plan:   WIFE TO PROVIDE CARE AT DC. REQUESTS RW FOR HOME.   Anticipated DC Date:  04/15/2013   Anticipated DC Plan:  HOME/SELF CARE      DC Planning Services  CM consult      Choice offered to / List presented to:     DME arranged  Levan Hurst      DME agency  [facility]        Status of service:  Completed, signed off Medicare Important Message given?   (If response is "NO", the following Medicare IM given date fields will be blank) Date Medicare IM given:   Date Additional Medicare IM given:    Discharge Disposition:  HOME/SELF CARE  Per UR Regulation:  Reviewed for med. necessity/level of care/duration of stay  If discussed at Long Length of Stay Meetings, dates discussed:    Comments:

## 2013-04-15 NOTE — Progress Notes (Addendum)
Assessment unchanged with normal vital signs. Removed epicardial pacing wires and instructed patient to remain on bedrest for 1hr. Patient verbalized understanding. CT sutures removed. Benzoin and 1/2 " steri strips applied.Will continue to monitor closely.  Genevive Bi, RN  EPW and CT sutures removed per order. VSS.  Will continue to monitor pt closely.  Gloriajean Dell

## 2013-04-15 NOTE — Progress Notes (Signed)
CARDIAC REHAB PHASE I   Ed completed. Pt walking independently with wife. Would like RW for home. Interested in [facility] and will send referral to [place]. [phone]  Harriet Masson CES, ACSM 04/15/2013 10:17 AM

## 2013-04-27 ENCOUNTER — Telehealth: Payer: Self-pay | Admitting: *Deleted

## 2013-04-27 NOTE — Telephone Encounter (Signed)
Faxed back cardiac rehab order.

## 2013-04-30 ENCOUNTER — Encounter: Payer: Self-pay | Admitting: Cardiology

## 2013-04-30 ENCOUNTER — Ambulatory Visit (INDEPENDENT_AMBULATORY_CARE_PROVIDER_SITE_OTHER): Payer: Medicare Other | Admitting: Cardiology

## 2013-04-30 ENCOUNTER — Other Ambulatory Visit: Payer: Self-pay | Admitting: *Deleted

## 2013-04-30 VITALS — BP 102/60 | HR 78 | Ht 71.0 in | Wt 259.7 lb

## 2013-04-30 DIAGNOSIS — Z951 Presence of aortocoronary bypass graft: Secondary | ICD-10-CM

## 2013-04-30 DIAGNOSIS — I472 Ventricular tachycardia: Secondary | ICD-10-CM

## 2013-04-30 DIAGNOSIS — G4730 Sleep apnea, unspecified: Secondary | ICD-10-CM

## 2013-04-30 DIAGNOSIS — R059 Cough, unspecified: Secondary | ICD-10-CM | POA: Insufficient documentation

## 2013-04-30 DIAGNOSIS — R05 Cough: Secondary | ICD-10-CM

## 2013-04-30 DIAGNOSIS — I2510 Atherosclerotic heart disease of native coronary artery without angina pectoris: Secondary | ICD-10-CM

## 2013-04-30 MED ORDER — ASPIRIN EC 81 MG PO TBEC: 81.0000 mg | DELAYED_RELEASE_TABLET | Freq: Every day | ORAL | Status: AC

## 2013-04-30 MED ORDER — LOSARTAN POTASSIUM 25 MG PO TABS: 25.0000 mg | ORAL_TABLET | Freq: Every day | ORAL | Status: DC

## 2013-04-30 NOTE — Assessment & Plan Note (Signed)
compliant

## 2013-04-30 NOTE — Assessment & Plan Note (Signed)
?   Secondary to ACE

## 2013-04-30 NOTE — Assessment & Plan Note (Signed)
He complains of a dry cough which he has had for some time, he had it pre op.

## 2013-04-30 NOTE — Progress Notes (Signed)
05/01/2013 David Hogan   [birth year]  [id number]  Primary Physicia Ardelle Balls, MD Primary Cardiologist: David Hogan  HPI:  67 y/o who was admitted 04/08/13 after a syncopal spell after he was stung by yellowjackets. He ruled in for a NSTEMI. Subsequent work revealed CAD and he ultimately underwent CABG X 3 with LIMA to LAD, SVG to DIAGONAL,a dn SVG to RAMUS INTERMEDIATE by David Tyrone Sage 04/11/13. He tolerated this well. He is here for his first post op visit. He has been walking without problems. He does have a cough which says he has had for a while ( he had pre op). His LVF was NL. He has OSA and is compliant with C-pap.    Current Outpatient Prescriptions  Medication Sig Dispense Refill  . albuterol (PROVENTIL HFA;VENTOLIN HFA) 108 (90 BASE) MCG/ACT inhaler Inhale 2 puffs into the lungs every 6 (six) hours as needed for wheezing or shortness of breath.      [name] albuterol (PROVENTIL) (2.5 MG/3ML) 0.083% nebulizer solution Take 2.5 mg by nebulization every 6 (six) hours as needed for wheezing or shortness of breath.      [name] atorvastatin (LIPITOR) 80 MG tablet Take 1 tablet (80 mg total) by mouth daily at 6 PM.  30 tablet  1  . benzonatate (TESSALON) 100 MG capsule Take 100 mg by mouth daily as needed for cough.      . budesonide-formoterol (SYMBICORT) 160-4.5 MCG/ACT inhaler Inhale 2 puffs into the lungs 2 (two) times daily.      . diclofenac (VOLTAREN) 75 MG EC tablet Take 75 mg by mouth 2 (two) times daily.      . fenofibrate 160 MG tablet Take 160 mg by mouth daily.      . fluticasone (FLONASE) 50 MCG/ACT nasal spray Place 2 sprays into the nose daily.      . meloxicam (MOBIC) 15 MG tablet Take 15 mg by mouth daily.      . metoprolol tartrate (LOPRESSOR) 25 MG tablet Take 1 tablet (25 mg total) by mouth 2 (two) times daily.  60 tablet  1  . montelukast (SINGULAIR) 10 MG tablet Take 10 mg by mouth every morning.       [name] aspirin EC 81 MG tablet Take 1 tablet (81 mg total) by mouth daily.  90 tablet   3  . losartan (COZAAR) 25 MG tablet Take 1 tablet (25 mg total) by mouth daily.  90 tablet  3   No current facility-administered medications for this visit.    No Known Allergies  History   Social History  . Marital Status: Married    Spouse Name: N/A    Number of Children: N/A  . Years of Education: N/A   Occupational History  . Not on file.   Social History Main Topics  . Smoking status: Never Smoker   . Smokeless tobacco: Never Used  . Alcohol Use: Yes     Comment: 04/09/2013 "last drink was in 1978 or so; never had a problem w/it"  . Drug Use: No  . Sexual Activity: Yes   Other Topics Concern  . Not on file   Social History Narrative  . No narrative on file     Review of Systems: General: negative for chills, fever, night sweats or weight changes.  Cardiovascular: negative for chest pain, dyspnea on exertion, edema, orthopnea, palpitations, paroxysmal nocturnal dyspnea or shortness of breath Dermatological: negative for rash Respiratory: negative for cough or wheezing Urologic: negative for hematuria Abdominal: negative for nausea, vomiting,  diarrhea, bright red blood per rectum, melena, or hematemesis Neurologic: negative for visual changes, syncope, or dizziness All other systems reviewed and are otherwise negative except as noted above.    Blood pressure 102/60, pulse 78, height 5\' 11"  (1.803 m), weight 259 lb 11.2 oz (117.799 kg).  General appearance: alert, cooperative, no distress and moderately obese Lungs: clear to auscultation bilaterally Heart: regular rate and rhythm  EKG NSR, TWI V2  ASSESSMENT AND PLAN:   S/P CABG x 3, LIMA-LAD; VG-Diag; VG-ramus intermediate 04/11/13 .  History of asthma He complains of a dry cough which he has had for some time, he had it pre op.  NSVT - NL LVF by echo .  Sleep apnea- on C-pap compliant  Cough ? Secondary to ACE    PLAN  Stop Lisinopril, start Losartan. Follow up with David Hogan in one  month.  Amye Grego KPA-C 05/01/2013 8:21 AM

## 2013-04-30 NOTE — Patient Instructions (Signed)
Your physician recommends that you schedule a follow-up appointment in: one month with Dr Herbie Baltimore Your physician has recommended you make the following change in your medication: decrease aspirin to 81 mg daily. Stop Lisinopril, start Losartan 25 mg daily.

## 2013-05-01 ENCOUNTER — Telehealth: Payer: Self-pay | Admitting: Cardiology

## 2013-05-01 NOTE — Telephone Encounter (Signed)
Returned call to Fannie Knee, pt's wife.  Informed message received.  Wife stated they didn't get a prescription for losartan 25 mg yesterday.  Informed Rx was sent electronically yesterday and should be at the pharmacy.  Wife stated she will call pharmacy now.

## 2013-05-01 NOTE — Telephone Encounter (Signed)
Please call-question about his medicine.Talked about changing his medicine,but didn't give him a prescription,

## 2013-05-05 ENCOUNTER — Ambulatory Visit
Admission: RE | Admit: 2013-05-05 | Discharge: 2013-05-05 | Disposition: A | Discharge status: Home / Self Care | Payer: Medicare Other | Source: Ambulatory Visit | Attending: Cardiothoracic Surgery | Admitting: Cardiothoracic Surgery

## 2013-05-05 ENCOUNTER — Ambulatory Visit (INDEPENDENT_AMBULATORY_CARE_PROVIDER_SITE_OTHER): Payer: Self-pay | Admitting: Physician Assistant

## 2013-05-05 VITALS — BP 117/73 | HR 92 | Resp 20 | Ht 71.0 in | Wt 259.0 lb

## 2013-05-05 DIAGNOSIS — I2510 Atherosclerotic heart disease of native coronary artery without angina pectoris: Secondary | ICD-10-CM

## 2013-05-05 DIAGNOSIS — Z951 Presence of aortocoronary bypass graft: Secondary | ICD-10-CM

## 2013-05-05 NOTE — Progress Notes (Signed)
[address]       [place] [address]             [phone]                  [facility] Medical Record #[id number] Date of Birth: [birth year]  Marykay Lex, MD Ardelle Balls, MD  Chief Complaint:   PostOp Follow Up Visit   History of Present Illness:     This is a 67 year old Caucasian male who is s/p CABG x 3 on 04/11/2013 after a NSTEMI by Dr. Tyrone Sage. He was discharged from the hospital in stable condition on 04/15/2013. He has continued to make steady progress. He denies any shortness of breath, chest pain, fever, or chills. He has not taken any narcotics for pain since the first week post operatively.   History  Smoking status  . Never Smoker   Smokeless tobacco  . Never Used       No Known Drug Allergies; Allergic to bees  Current Outpatient Prescriptions  Medication Sig Dispense Refill  . albuterol (PROVENTIL HFA;VENTOLIN HFA) 108 (90 BASE) MCG/ACT inhaler Inhale 2 puffs into the lungs every 6 (six) hours as needed for wheezing or shortness of breath.      [name] albuterol (PROVENTIL) (2.5 MG/3ML) 0.083% nebulizer solution Take 2.5 mg by nebulization every 6 (six) hours as needed for wheezing or shortness of breath.      [name] aspirin EC 81 MG tablet Take 1 tablet (81 mg total) by mouth daily.  90 tablet  3  . atorvastatin (LIPITOR) 80 MG tablet Take 1 tablet (80 mg total) by mouth daily at 6 PM.  30 tablet  1  . benzonatate (TESSALON) 100 MG capsule Take 100 mg by mouth daily as needed for cough.      . budesonide-formoterol (SYMBICORT) 160-4.5 MCG/ACT inhaler Inhale 2 puffs into the lungs 2 (two) times daily.      . diclofenac (VOLTAREN) 75 MG EC tablet Take 75 mg by mouth 2 (two) times daily.      . fenofibrate 160 MG tablet Take 160 mg by mouth daily.      . fluticasone (FLONASE) 50 MCG/ACT nasal spray Place 2 sprays into the nose daily.      [name] losartan (COZAAR) 25 MG tablet Take 1 tablet (25 mg total) by mouth daily.  90 tablet  3    . meloxicam (MOBIC) 15 MG tablet Take 15 mg by mouth daily.      . metoprolol tartrate (LOPRESSOR) 25 MG tablet Take 1 tablet (25 mg total) by mouth 2 (two) times daily.  60 tablet  1  . montelukast (SINGULAIR) 10 MG tablet Take 10 mg by mouth every morning.        No current facility-administered medications for this visit.       Physical Exam: BP 117/73  Pulse 92  Resp 20  Ht 5\' 11"  (1.803 m)  Wt 117.482 kg (259 lb)  BMI 36.14 kg/m2  SpO2 98%  General appearance: alert, cooperative and no distress Neurologic: intact Heart: regular rate and rhythm, S1, S2 normal, no murmur, click, rub or gallop Lungs: clear to auscultation bilaterally Abdomen: soft, non-tender; bowel sounds normal; no masses,  no organomegaly Extremities: No cyanosis, clubbing, or edema Wounds: Clean and dry. No signs of infection.  Diagnostic Studies & Laboratory data:         Recent Radiology Findings: Dg Chest 2 View  05/05/2013  CLINICAL DATA:  Postop CABG.  EXAM: CHEST  2 VIEW  COMPARISON:  04/13/2013  FINDINGS: Heart size is upper normal. Negative for heart failure. Lungs are clear without infiltrate or effusion. Negative for mass lesion.  IMPRESSION: No active cardiopulmonary disease.   Electronically Signed   By: Marlan Palau M.D.   On: 05/05/2013 12:53      Recent Labs: Lab Results  Component Value Date   WBC 9.5 04/15/2013   HGB 12.7* 04/15/2013   HCT 35.6* 04/15/2013   PLT 186 04/15/2013   GLUCOSE 114* 04/13/2013   CHOL 152 04/10/2013   TRIG 125 04/10/2013   HDL 34* 04/10/2013   LDLCALC 93 04/10/2013   ALT 42 04/10/2013   AST 40* 04/10/2013   NA 136 04/13/2013   K 3.7 04/13/2013   CL 99 04/13/2013   CREATININE 0.96 04/13/2013   BUN 20 04/13/2013   CO2 28 04/13/2013   INR 1.40 04/11/2013   HGBA1C 5.4 04/10/2013      Assessment / Plan:   Overall, David Hogan continues to make steady progress following coronary artery bypass surgery. He has already seen his medical doctor ( Dr. Marla Roe) in follow up. He was  given a prescription for an Epi pen, as he is allergic to bees. Lab work was also drawn. He will return to his medical doctor to review the results. He has an appointment to see Dr. Herbie Baltimore in a few weeks. He was instructed since he is not taking narcotics for pain, that he may begin driving short distances (i.e. 30 minutes or less) during the day for the next week. He may then increase his frequency and duration as tolerates. He was instructed to continue with sternal precautions (i.e. No lifting more than 10 pounds) for the next 4 weeks. He inquired if he could go back to the [facility]. I told him it was ok to be on the treadmill or stationary bike. No elliptical or weight machines for at least 4 weeks. He was also told he may go waist deep in the pool to walk, but no swimming for at least one month. He will return to see Dr. Tyrone Sage PRN.      Rainbow Salman M PA-C 05/05/2013 1:23 PM

## 2013-05-05 NOTE — Patient Instructions (Signed)
Coronary Artery Bypass Grafting  Care After  Refer to this sheet in the next few weeks. These instructions provide you with information on caring for yourself after your procedure. Your caregiver may also give you more specific instructions. Your treatment has been planned according to current medical practices, but problems sometimes occur. Call your caregiver if you have any problems or questions after your procedure.   Recovery from open heart surgery will be different for everyone. Some people feel well after 3 or 4 weeks, while for others it takes longer. After heart surgery, it may be normal to:  · Not have an appetite, feel nauseated by the smell of food, or only want to eat a small amount.  · Be constipated because of changes in your diet, activity, and medicines. Eat foods high in fiber. Add fresh fruits and vegetables to your diet. Stool softeners may be helpful.  · Feel sad or unhappy. You may be frustrated or cranky. You may have good days and bad days. Do not give up. Talk to your caregiver if you do not feel better.  · Feel weakness and fatigue. You many need physical therapy or cardiac rehabilitation to get your strength back.  · Develop an irregular heartbeat called atrial fibrillation. Symptoms of atrial fibrillation are a fast, irregular heartbeat or feelings of fluttery heartbeats, shortness of breath, low blood pressure, and dizziness. If these symptoms develop, see your caregiver right away.  MEDICATION  · Have a list of all the medicines you will be taking when you leave the hospital. For every medicine, know the following:  · Name.  · Exact dose.  · Time of day to be taken.  · How often it should be taken.  · Why you are taking it.  · Ask which medicines should or should not be taken together. If you take more than one heart medicine, ask if it is okay to take them together. Some heart medicines should not be taken at the same time because they may lower your blood pressure too  much.  · Narcotic pain medicine can cause constipation. Eat fresh fruits and vegetables. Add fiber to your diet. Stool softener medicine may help relieve constipation.  · Keep a copy of your medicines with you at all times.  · Do not add or stop taking any medicine until you check with your caregiver.  · Medicines can have side effects. Call your caregiver who prescribed the medicine if you:  · Start throwing up, have diarrhea, or have stomach pain.  · Feel dizzy or lightheaded when you stand up.  · Feel your heart is skipping beats or is beating too fast or too slow.  · Develop a rash.  · Notice unusual bruising or bleeding.  HOME CARE INSTRUCTIONS  · After heart surgery, it is important to learn how to take your pulse. Have your caregiver show you how to take your pulse.  · Use your incentive spirometer. Ask your caregiver how long after surgery you need to use it.  Care of your chest incision  · Tell your caregiver right away if you notice clicking in your chest (sternum).  · Support your chest with a pillow or your arms when you take deep breaths and cough.  · Follow your caregiver's instructions about when you can bathe or swim.  · Protect your incision from sunlight during the first year to keep the scar from getting dark.  · Tell your caregiver if you notice:  · Increased tenderness   of your incision.  · Increased redness or swelling around your incision.  · Drainage or pus from your incision.  Care of your leg incision(s)  · Avoid crossing your legs.  · Avoid sitting for long periods of time. Change positions every half hour.  · Elevate your leg(s) when you are sitting.  · Check your leg(s) daily for swelling. Check the incisions for redness or drainage.  · Wear your elastic stockings as told by your caregiver. Take them off at bedtime.  Diet  · Diet is very important to heart health.  · Eat plenty of fresh fruits and vegetables. Meats should be lean cut. Avoid canned, processed, and fried foods.  · Talk to a  dietician. They can teach you how to make healthy food and drink choices.  Weight  · Weigh yourself every day. This is important because it helps to know if you are retaining fluid that may make your heart and lungs work harder.  · Use the same scale each time.  · Weigh yourself every morning at the same time. You should do this after you go to the bathroom, but before you eat breakfast.  · Your weight will be more accurate if you do not wear any clothes.  · Record your weight.  · Tell your caregiver if you have gained 2 pounds or more overnight.  Activity  Stop any activity at once if you have chest pain, shortness of breath, irregular heartbeats, or dizziness. Get help right away if you have any of these symptoms.  · Bathing.  Avoid soaking in a bath or hot tub until your incisions are healed.  · Rest. You need a balance of rest and activity.  · Exercise. Exercise per your caregiver's advice. You may need physical therapy or cardiac rehabilitation to help strengthen your muscles and build your endurance.  · Climbing stairs. Unless your caregiver tells you not to climb stairs, go up stairs slowly and rest if you tire. Do not pull yourself up by the handrail.  · Driving a car. Follow your caregiver's advice on when you may drive. You may ride as a passenger at any time. When traveling for long periods of time in a car, get out of the car and walk around for a few minutes every 2 hours.  · Lifting. Avoid lifting, pushing, or pulling anything heavier than 10 pounds for 6 weeks after surgery or as told by your caregiver.  · Returning to work. Check with your caregiver. People heal at different rates. Most people will be able to go back to work 6 to 12 weeks after surgery.  · Sexual activity. You may resume sexual relations as told by your caregiver.  SEEK MEDICAL CARE IF:  · Any of your incisions are red, painful, or have any type of drainage coming from them.  · You have an oral temperature above 102° F (38.9°  C).  · You have ankle or leg swelling.  · You have pain in your legs.  · You have weight gain of 2 or more pounds a day.  · You feel dizzy or lightheaded when you stand up.  SEEK IMMEDIATE MEDICAL CARE IF:  · You have angina or chest pain that goes to your jaw or arms. Call your local emergency services right away.  · You have shortness of breath at rest or with activity.  · You have a fast or irregular heartbeat (arrhythmia).  · There is a "clicking" in your sternum when you   move.  · You have numbness or weakness in your arms or legs.  MAKE SURE YOU:  · Understand these instructions.  · Will watch your condition.  · Will get help right away if you are not doing well or get worse.  Document Released: 02/10/2005 Document Revised: 10/16/2011 Document Reviewed: 09/28/2010  Exit[employer]® Patient Information ©2014 [employer].

## 2013-05-29 ENCOUNTER — Other Ambulatory Visit: Payer: Self-pay | Admitting: *Deleted

## 2013-05-29 MED ORDER — ATORVASTATIN CALCIUM 80 MG PO TABS: 80.0000 mg | ORAL_TABLET | Freq: Every day | ORAL | Status: DC

## 2013-05-29 NOTE — Telephone Encounter (Signed)
Rx was sent to pharmacy electronically. 

## 2013-06-02 ENCOUNTER — Telehealth: Payer: Self-pay | Admitting: *Deleted

## 2013-06-02 DIAGNOSIS — I252 Old myocardial infarction: Secondary | ICD-10-CM | POA: Insufficient documentation

## 2013-06-02 NOTE — Telephone Encounter (Signed)
Faxed back cardiac rehab prescription back to [facility].

## 2013-06-03 ENCOUNTER — Encounter: Payer: Self-pay | Admitting: Cardiology

## 2013-06-03 ENCOUNTER — Ambulatory Visit (INDEPENDENT_AMBULATORY_CARE_PROVIDER_SITE_OTHER): Payer: Medicare Other | Admitting: Cardiology

## 2013-06-03 VITALS — BP 140/78 | HR 72 | Ht 68.5 in | Wt 256.5 lb

## 2013-06-03 DIAGNOSIS — I2510 Atherosclerotic heart disease of native coronary artery without angina pectoris: Secondary | ICD-10-CM

## 2013-06-03 DIAGNOSIS — E669 Obesity, unspecified: Secondary | ICD-10-CM

## 2013-06-03 DIAGNOSIS — I10 Essential (primary) hypertension: Secondary | ICD-10-CM

## 2013-06-03 DIAGNOSIS — I4729 Other ventricular tachycardia: Secondary | ICD-10-CM

## 2013-06-03 DIAGNOSIS — Z951 Presence of aortocoronary bypass graft: Secondary | ICD-10-CM

## 2013-06-03 DIAGNOSIS — E785 Hyperlipidemia, unspecified: Secondary | ICD-10-CM

## 2013-06-03 DIAGNOSIS — I214 Non-ST elevation (NSTEMI) myocardial infarction: Secondary | ICD-10-CM

## 2013-06-03 DIAGNOSIS — R55 Syncope and collapse: Secondary | ICD-10-CM

## 2013-06-03 DIAGNOSIS — I472 Ventricular tachycardia: Secondary | ICD-10-CM

## 2013-06-03 MED ORDER — FENOFIBRATE 160 MG PO TABS: 160.0000 mg | ORAL_TABLET | Freq: Every day | ORAL | Status: DC

## 2013-06-03 MED ORDER — METOPROLOL TARTRATE 25 MG PO TABS: 25.0000 mg | ORAL_TABLET | Freq: Two times a day (BID) | ORAL | Status: DC

## 2013-06-03 MED ORDER — ATORVASTATIN CALCIUM 80 MG PO TABS: 80.0000 mg | ORAL_TABLET | Freq: Every day | ORAL | Status: DC

## 2013-06-03 MED ORDER — HYDROCHLOROTHIAZIDE 25 MG PO TABS: 25.0000 mg | ORAL_TABLET | Freq: Every day | ORAL | Status: DC

## 2013-06-03 NOTE — Patient Instructions (Signed)
You look great.  Glad to see that you have recovered well from the operation.  Keep up the gradual exercise. We will put in the referral to Cardiac Rehab -- try to make it through as long as possible.  Your Blood Pressure & Heart Rate look good & you are doing great with weight loss.  We will recheck your cholesterol panel in ~ March before I see you back. Marykay Lex, MD

## 2013-06-03 NOTE — Progress Notes (Signed)
PATIENT: David Hogan MRN: [id number]  DOB: June 18, 1946   DOV:06/05/2013 PCP: Ardelle Balls, MD  Clinic Note: Chief Complaint  Patient presents with  . 3 MONTH VISIT    post hosp cabg;   NO CHEST PAIN,NO SOB, NO EDEMA    HPI: David Hogan is a 67 y.o. male with a PMH below who presents today for his second post CABG followup. He saw Corine Shelter, [place] on September 25 for his first followup, and was doing quite well. David Hogan initially presented to the hospital by EMS on April 08, 2013 after an episode of what seemed to be syncope. He was working on helping his sister-in-law move, and was cleaning up the attic. He is noticing that he been getting progressively worsening dyspnea on exertion leading up to this, but hadn't thought much of it, simply begin with due to his allergies and her weight. He apparently was taking a trashcan down out of the attic and make it outside the trashcan apparently had a bees nest that got disrupted when he put it down and he was stung by multiple bees. He then went into the house to cool down was red and flushed all over. He then was found down by his family. Was unclear exactly how long he was down. When EMS got there he had no arrhythmias noted and was doing relatively well. He was evaluated with cardiac biomarkers which actually showed signs of elevation consistent with myocardial infarction. Due to positive troponins and evidence of nonsustained V. tach on his telemetry, he was taken to the cardiac catheterization lab for definitive cardiac evaluation. This showed severe ostial and proximal LAD as well as very proximal ramus intermedius disease that was not amenable to PCI. He was referred for bypass grafting (CABG x3: LIMA-LAD, SVG-diagonal, SVG-RI) which he had done on September 5 by Dr. Tyrone Sage. He had a relatively unremarkable postop course, and was discharged home on September 9 (postop day 4). Including during his hospitalization to now he has lost close to 20  pounds from his initial weight. He is at a significant adjustment to his diet, and has now started to get back into doing some exercise. He has been cleared by Dr. Tyrone Sage for driving and other activities. He is currently contemplating cardiac rehabilitation.  Interval History: David Hogan looks great today. He clearly has lost weight. He says he feels much better and he had bleeding at the operation. He is no longer dyspneic doing routine activities around the house. He still has some congestion related dyspnea but certainly not to the extent of what he had. He denies any chest tightness or pressure with rest or exertion. No PND, orthopnea or edema. No irregular heartbeats or rapid heartbeats or palpitations. No syncope or near syncope no TIA or amaurosis fugax symptoms. No claudication symptoms. No melena, hematochezia or hematuria. He has had his pneumonia booster shot. He was also tested and is found to be allergic to bee stings as well once and lost. The plan is to treat him with desensitization injections.  He does have some mild incisional soreness, but is much better. He denies any myalgias or arthralgias with his IVUS atorvastatin.  Past Medical History  Diagnosis Date  . Asthma   . Complication of anesthesia     "woke up wild in recovery after gallbladder OR" (04/08/2013)  . Hypertension   . Heart murmur   . Exertional shortness of breath   . OSA on CPAP   . CAD, multiple vessel not amenable  to PCI 04/12/2013  . S/P CABG x 3, LIMA-LAD; VG-Diag; VG-ramus intermediate 04/11/13 04/12/2013    Prior Cardiac Evaluation and Past Surgical History: Past Surgical History  Procedure Laterality Date  . Shoulder open rotator cuff repair Bilateral 1990's-2000's  . Incision and drainage of wound  ~ 1996    "hit elbow at work; it swelled up; had it opened up to get pus out" (04/08/2013)  . Cholecystectomy  ~ 2010  . Hernia repair  ~ 2012    "from the gallbladder OR" (04/08/2013)  . Carpal tunnel release Left  1990's  . Coronary artery bypass graft N/A 04/11/2013    Procedure: CORONARY ARTERY BYPASS GRAFTING (CABGx3: LIMA-LAD, SVG-RI, SVG-D1) ;  Surgeon: Delight Ovens, MD;  Location: [facility] OR;  Service: Open Heart Surgery;  Laterality: N/A;  x3 using right greater saphenous vein and left internal mammary.  . Intraoperative transesophageal echocardiogram N/A 04/11/2013    Procedure: INTRAOPERATIVE TRANSESOPHAGEAL ECHOCARDIOGRAM;  Surgeon: Delight Ovens, MD;  Location: [facility] OR;  Service: Open Heart Surgery;  Laterality: N/A;  . Transthoracic echocardiogram  04/09/2013    EF 65-70%. Normal wall motion. Grade 1 diastolic dysfunction. Aortic sclerosis but no stenosis.  . Cardiac catheterization  04/09/2013    Ostial and proximal LAD involving D1 80-90% stenoses, ramus intermedius proximal to ostial 90%. Otherwise mild diffuse disease in the circumflex and RCA just region.    Allergies  Allergen Reactions  . Bee Venom     Yellow jackets    Current Outpatient Prescriptions  Medication Sig Dispense Refill  . albuterol (PROVENTIL HFA;VENTOLIN HFA) 108 (90 BASE) MCG/ACT inhaler Inhale 2 puffs into the lungs every 6 (six) hours as needed for wheezing or shortness of breath.      [name] albuterol (PROVENTIL) (2.5 MG/3ML) 0.083% nebulizer solution Take 2.5 mg by nebulization every 6 (six) hours as needed for wheezing or shortness of breath.      [name] aspirin EC 81 MG tablet Take 1 tablet (81 mg total) by mouth daily.  90 tablet  3  . atorvastatin (LIPITOR) 80 MG tablet Take 1 tablet (80 mg total) by mouth daily at 6 PM.  90 tablet  3  . benzonatate (TESSALON) 100 MG capsule Take 100 mg by mouth daily as needed for cough.      . budesonide-formoterol (SYMBICORT) 160-4.5 MCG/ACT inhaler Inhale 2 puffs into the lungs 2 (two) times daily.      . cetirizine (ZYRTEC) 10 MG tablet Take 10 mg by mouth daily.      . diclofenac (VOLTAREN) 75 MG EC tablet Take 75 mg by mouth 2 (two) times daily.      . fenofibrate 160 MG tablet  Take 1 tablet (160 mg total) by mouth daily.  903 tablet  3  . hydrochlorothiazide (HYDRODIURIL) 25 MG tablet Take 1 tablet (25 mg total) by mouth daily.  90 tablet  3  . HYDROcodone-acetaminophen (NORCO/VICODIN) 5-325 MG per tablet Take by mouth every 6 (six) hours as needed for pain. Take 1 to 2 tablets      . losartan (COZAAR) 25 MG tablet Take 1 tablet (25 mg total) by mouth daily.  90 tablet  3  . meloxicam (MOBIC) 15 MG tablet Take 15 mg by mouth as needed.       . montelukast (SINGULAIR) 10 MG tablet Take 10 mg by mouth every morning.       [name] oxycodone (OXY-IR) 5 MG capsule Take 5 mg by mouth every 4 (four) hours as  needed.      [name] EPIPEN 2-PAK 0.3 MG/0.3ML SOAJ injection       . fluticasone (FLONASE) 50 MCG/ACT nasal spray Place 2 sprays into the nose daily.      . metoprolol tartrate (LOPRESSOR) 25 MG tablet Take 1 tablet (25 mg total) by mouth 2 (two) times daily.  180 tablet  3   No current facility-administered medications for this visit.    History   Social History Narrative   Married for 32 years. No living children. The 2 grandchildren. He is a retired Biochemist, clinical from [employer]   He never smoked. Does not drink alcohol.   He currently walks 2-3 times a day since discharge.    ROS: A comprehensive Review of Systems - Negative except Mild postop aches and pains. Also allergies are still present. His cough is better.  PHYSICAL EXAM BP 140/78  Pulse 72  Ht 5' 8.5" (1.74 m)  Wt 256 lb 8 oz (116.348 kg)  BMI 38.43 kg/m2 General appearance: alert, cooperative, appears stated age, no distress, moderately obese and Notably slimmer than preop. Well groomed. Mood and affect are very pleasant Neck: no adenopathy, no carotid bruit, no JVD, supple, symmetrical, trachea midline and thick, difficult to assess Lungs: clear to auscultation bilaterally, normal percussion bilaterally and Nonlabored, good air movement Heart: regular rate and rhythm, S1, S2 normal, no  murmur, click, rub or gallop and normal apical impulse Abdomen: soft, non-tender; bowel sounds normal; no masses,  no organomegaly Extremities: extremities normal, atraumatic, no cyanosis or edema Pulses: 2+ and symmetric Neurologic: Grossly normal HEENT: [place]/AT, EOMI, MMM, anicteric sclera  [id number] today: Yes Rate: 72 , Rhythm: Sinus Rhythm with Sinus Arrhythmia and Nonspecific ST-T abnormalities:   Recent labs: No new labs  ASSESSMENT / PLAN: CAD, multiple vessel not amenable to PCI at cath 04/09/13 He definitely feels much better post CABG. He did not really have any true anginal symptoms but further review would suggest he had relatively significant exertional dyspnea probably at least class II prior to his event that led to his Cath And CABG. 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  NSVT - NL LVF by echo Thankfully stable. No adverse effects. On beta blocker and was controlled in the hospital.  NSTEMI (non-ST elevated myocardial infarction) Normal EF by echo. I suspect his episode was related to an allergic reaction to bee stings, this led to hypotension and syncope which allowed for a Type II Myocardial Infarction. Continue aspirin, would consider dual antiplatelet coverage, but this was likely not an acute coronary syndrome.  I recommended 8 deathly goes to phase II cardiac rehabilitation. He did voice some concerns with the financial aspects for the $45 co-pay for each session. I admonished him and his wife that they would need to be evaluated the stool for the first month. Hoping that he would then continue beyond that. He needs the exercise guidance, nutritional guidance and emotional support.  Syncope - on admission after bee sting.  No recurrent symptoms since the bee stings. Question was whether this  related to bee stings or even potentially nonsustained VT. He is not had any further episodes.  HTN (hypertension), benign His blood pressure is up a little bit here today. He said home it was great this morning and yesterday at his primary care physician's office and was also  very well controlled. For now will continue on this current regimen. We could increase his ARB or beta blocker dose up, simply because of his size, to a more target dose.  Dyslipidemia, goal LDL below 70 Currently on high-dose atorvastatin. Will check lipids and chemistry panel prior to 3 month followup.  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 Pending cardiac rehabilitation.  Obesity, unspecified I am hoping that with the initiation of cardiac rehabilitation, and is already beginning attempts with diet exercise, and we will continue to lose weight. I discussed with him the normal oh for weight loss would be 10% of his body weight which would be about 28 pounds based on the fact his pre-CABG weight was 280 pounds. He is well as way to that goal already, but it did remind him that continued weight loss is were the difficulty becomes apparent. He should shoot for anywhere from 11/2-3 pounds down each month.  I essentially encouraged him to start off with cardiac rehabilitation and then continue afterwards with some type of organized regimen at home Or the gym.    Orders Placed This Encounter  Procedures  . Lipid Profile    Standing Status: Future     Number of Occurrences:      Standing Expiration Date: 06/03/2014  . Comp Met (CMET)    Standing Status: Future     Number of Occurrences:      Standing Expiration Date: 06/03/2014  . EKG 12-Lead    Followup: 3 months  David Hogan, M.D., M.S. [facility] [address] [place], [place]  [address]  [phone] Pager # [phone]

## 2013-06-05 ENCOUNTER — Encounter: Payer: Self-pay | Admitting: Cardiology

## 2013-06-05 NOTE — Assessment & Plan Note (Signed)
Currently on high-dose atorvastatin. Will check lipids and chemistry panel prior to 3 month followup.

## 2013-06-05 NOTE — Assessment & Plan Note (Signed)
His blood pressure is up a little bit here today. He said home it was great this morning and yesterday at his primary care physician's office and was also very well controlled. For now will continue on this current regimen. We could increase his ARB or beta blocker dose up, simply because of his size, to a more target dose.

## 2013-06-05 NOTE — Assessment & Plan Note (Addendum)
Normal EF by echo. I suspect his episode was related to an allergic reaction to bee stings, this led to hypotension and syncope which allowed for a Type II Myocardial Infarction. Continue aspirin, would consider dual antiplatelet coverage, but this was likely not an acute coronary syndrome.  I recommended 8 deathly goes to phase II cardiac rehabilitation. He did voice some concerns with the financial aspects for the $45 co-pay for each session. I admonished him and his wife that they would need to be evaluated the stool for the first month. Hoping that he would then continue beyond that. He needs the exercise guidance, nutritional guidance and emotional support.

## 2013-06-05 NOTE — Assessment & Plan Note (Signed)
No recurrent symptoms since the bee stings. Question was whether this related to bee stings or even potentially nonsustained VT. He is not had any further episodes.

## 2013-06-05 NOTE — Assessment & Plan Note (Addendum)
I am hoping that with the initiation of cardiac rehabilitation, and is already beginning attempts with diet exercise, and we will continue to lose weight. I discussed with him the normal oh for weight loss would be 10% of his body weight which would be about 28 pounds based on the fact his pre-CABG weight was 280 pounds. He is well as way to that goal already, but it did remind him that continued weight loss is were the difficulty becomes apparent. He should shoot for anywhere from 11/2-3 pounds down each month.  I essentially encouraged him to start off with cardiac rehabilitation and then continue afterwards with some type of organized regimen at home Or the gym.

## 2013-06-05 NOTE — Assessment & Plan Note (Signed)
Recovering well. Pending cardiac rehabilitation.

## 2013-06-05 NOTE — Assessment & Plan Note (Signed)
Thankfully stable. No adverse effects. On beta blocker and was controlled in the hospital.

## 2013-06-05 NOTE — Assessment & Plan Note (Signed)
He definitely feels much better post CABG. He did not really have any true anginal symptoms but further review would suggest he had relatively significant exertional dyspnea probably at least class II prior to his event that led to his Cath And CABG. 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

## 2013-06-17 ENCOUNTER — Telehealth: Payer: Self-pay | Admitting: *Deleted

## 2013-06-17 NOTE — Telephone Encounter (Signed)
Faxed cardiac rehab phase  ll order-signed by  Dr Herbie Baltimore.

## 2013-06-19 ENCOUNTER — Encounter (HOSPITAL_COMMUNITY)
Admission: RE | Admit: 2013-06-19 | Discharge: 2013-06-19 | Disposition: A | Discharge status: Home / Self Care | Payer: Medicare Other | Source: Ambulatory Visit | Attending: Cardiology | Admitting: Cardiology

## 2013-06-19 DIAGNOSIS — Z951 Presence of aortocoronary bypass graft: Secondary | ICD-10-CM | POA: Insufficient documentation

## 2013-06-19 DIAGNOSIS — Z5189 Encounter for other specified aftercare: Secondary | ICD-10-CM | POA: Insufficient documentation

## 2013-06-19 DIAGNOSIS — I2510 Atherosclerotic heart disease of native coronary artery without angina pectoris: Secondary | ICD-10-CM | POA: Insufficient documentation

## 2013-06-19 DIAGNOSIS — I252 Old myocardial infarction: Secondary | ICD-10-CM | POA: Insufficient documentation

## 2013-06-19 NOTE — Progress Notes (Signed)
Cardiac Rehab Medication Review by a Pharmacist  Does the patient  feel that his/her medications are working for him/her?  yes  Has the patient been experiencing any side effects to the medications prescribed?  no  Does the patient measure his/her own blood pressure or blood glucose at home?  no   Does the patient have any problems obtaining medications due to transportation or finances?   no  Understanding of regimen: good Understanding of indications: good Potential of compliance: good   Pharmacist comments: Patient had a good understanding of his medications. He did not have any concerns with them.   Christiane Ha A. Lenon Ahmadi, PharmD Clinical Pharmacist - Resident Pager: [phone] Pharmacy: [phone] 06/19/2013 9:25 AM

## 2013-06-25 ENCOUNTER — Encounter (HOSPITAL_COMMUNITY): Payer: Medicare Other

## 2013-06-25 ENCOUNTER — Encounter (HOSPITAL_COMMUNITY)
Admission: RE | Admit: 2013-06-25 | Discharge: 2013-06-25 | Disposition: A | Discharge status: Home / Self Care | Payer: Medicare Other | Source: Ambulatory Visit | Attending: Cardiology | Admitting: Cardiology

## 2013-06-25 NOTE — Progress Notes (Signed)
Pt started cardiac rehab today.  Pt tolerated light exercise without difficulty. Telemetry rhythm Sinus with arrythmia, this had been previously documented on 12 lead ECG. Vital signs stable. Will continue to monitor the patient throughout  the program.

## 2013-06-27 ENCOUNTER — Encounter (HOSPITAL_COMMUNITY)
Admission: RE | Admit: 2013-06-27 | Discharge: 2013-06-27 | Disposition: A | Discharge status: Home / Self Care | Payer: Medicare Other | Source: Ambulatory Visit | Attending: Cardiology | Admitting: Cardiology

## 2013-06-27 ENCOUNTER — Encounter (HOSPITAL_COMMUNITY): Payer: Medicare Other

## 2013-06-30 ENCOUNTER — Encounter (HOSPITAL_COMMUNITY)
Admission: RE | Admit: 2013-06-30 | Discharge: 2013-06-30 | Disposition: A | Discharge status: Home / Self Care | Payer: Medicare Other | Source: Ambulatory Visit | Attending: Cardiology | Admitting: Cardiology

## 2013-07-02 ENCOUNTER — Encounter (HOSPITAL_COMMUNITY)
Admission: RE | Admit: 2013-07-02 | Discharge: 2013-07-02 | Disposition: A | Discharge status: Home / Self Care | Payer: Medicare Other | Source: Ambulatory Visit | Attending: Cardiology | Admitting: Cardiology

## 2013-07-02 ENCOUNTER — Encounter (HOSPITAL_COMMUNITY): Payer: Medicare Other

## 2013-07-02 NOTE — Progress Notes (Signed)
Reviewed home exercise with pt today.  Pt plans to walk at home and go to [employer] for exercise.  Reviewed THR, pulse, RPE, sign and symptoms, and when to call 911 or MD.  Pt voiced understanding. Tierney Behl, MA, ACSM RCEP  

## 2013-07-04 ENCOUNTER — Encounter (HOSPITAL_COMMUNITY): Payer: Medicare Other

## 2013-07-09 ENCOUNTER — Encounter (HOSPITAL_COMMUNITY): Payer: Medicare Other

## 2013-07-09 ENCOUNTER — Encounter (HOSPITAL_COMMUNITY)
Admission: RE | Admit: 2013-07-09 | Discharge: 2013-07-09 | Disposition: A | Discharge status: Home / Self Care | Payer: Medicare Other | Source: Ambulatory Visit | Attending: Cardiology | Admitting: Cardiology

## 2013-07-09 DIAGNOSIS — Z951 Presence of aortocoronary bypass graft: Secondary | ICD-10-CM | POA: Insufficient documentation

## 2013-07-09 DIAGNOSIS — I2510 Atherosclerotic heart disease of native coronary artery without angina pectoris: Secondary | ICD-10-CM | POA: Insufficient documentation

## 2013-07-09 DIAGNOSIS — Z5189 Encounter for other specified aftercare: Secondary | ICD-10-CM | POA: Insufficient documentation

## 2013-07-09 DIAGNOSIS — I252 Old myocardial infarction: Secondary | ICD-10-CM | POA: Insufficient documentation

## 2013-07-11 ENCOUNTER — Encounter (HOSPITAL_COMMUNITY)
Admission: RE | Admit: 2013-07-11 | Discharge: 2013-07-11 | Disposition: A | Discharge status: Home / Self Care | Payer: Medicare Other | Source: Ambulatory Visit | Attending: Cardiology | Admitting: Cardiology

## 2013-07-11 ENCOUNTER — Encounter (HOSPITAL_COMMUNITY): Payer: Medicare Other

## 2013-07-16 ENCOUNTER — Encounter (HOSPITAL_COMMUNITY)
Admission: RE | Admit: 2013-07-16 | Discharge: 2013-07-16 | Disposition: A | Discharge status: Home / Self Care | Payer: Medicare Other | Source: Ambulatory Visit | Attending: Cardiology | Admitting: Cardiology

## 2013-07-16 ENCOUNTER — Encounter (HOSPITAL_COMMUNITY): Payer: Medicare Other

## 2013-07-18 ENCOUNTER — Encounter (HOSPITAL_COMMUNITY)
Admission: RE | Admit: 2013-07-18 | Discharge: 2013-07-18 | Disposition: A | Discharge status: Home / Self Care | Payer: Medicare Other | Source: Ambulatory Visit | Attending: Cardiology | Admitting: Cardiology

## 2013-07-18 ENCOUNTER — Encounter (HOSPITAL_COMMUNITY): Payer: Medicare Other

## 2013-07-23 ENCOUNTER — Encounter (HOSPITAL_COMMUNITY)
Admission: RE | Admit: 2013-07-23 | Discharge: 2013-07-23 | Disposition: A | Discharge status: Home / Self Care | Payer: Medicare Other | Source: Ambulatory Visit | Attending: Cardiology | Admitting: Cardiology

## 2013-07-23 ENCOUNTER — Encounter (HOSPITAL_COMMUNITY): Payer: Medicare Other

## 2013-07-25 ENCOUNTER — Encounter (HOSPITAL_COMMUNITY)
Admission: RE | Admit: 2013-07-25 | Discharge: 2013-07-25 | Disposition: A | Discharge status: Home / Self Care | Payer: Medicare Other | Source: Ambulatory Visit | Attending: Cardiology | Admitting: Cardiology

## 2013-07-25 ENCOUNTER — Encounter (HOSPITAL_COMMUNITY): Payer: Medicare Other

## 2013-07-30 ENCOUNTER — Encounter (HOSPITAL_COMMUNITY): Payer: Medicare Other

## 2013-07-30 ENCOUNTER — Encounter (HOSPITAL_COMMUNITY)
Admission: RE | Admit: 2013-07-30 | Discharge: 2013-07-30 | Disposition: A | Discharge status: Home / Self Care | Payer: Medicare Other | Source: Ambulatory Visit | Attending: Cardiology | Admitting: Cardiology

## 2013-08-01 ENCOUNTER — Encounter (HOSPITAL_COMMUNITY): Payer: Medicare Other

## 2013-08-06 ENCOUNTER — Encounter (HOSPITAL_COMMUNITY): Payer: Medicare Other

## 2013-08-06 ENCOUNTER — Encounter (HOSPITAL_COMMUNITY)
Admission: RE | Admit: 2013-08-06 | Discharge: 2013-08-06 | Disposition: A | Discharge status: Home / Self Care | Payer: Medicare Other | Source: Ambulatory Visit | Attending: Cardiology | Admitting: Cardiology

## 2013-08-08 ENCOUNTER — Encounter (HOSPITAL_COMMUNITY): Payer: Medicare Other

## 2013-08-08 ENCOUNTER — Encounter (HOSPITAL_COMMUNITY)
Admission: RE | Admit: 2013-08-08 | Discharge: 2013-08-08 | Disposition: A | Discharge status: Home / Self Care | Payer: Medicare Other | Source: Ambulatory Visit | Attending: Cardiology | Admitting: Cardiology

## 2013-08-08 DIAGNOSIS — Z5189 Encounter for other specified aftercare: Secondary | ICD-10-CM | POA: Insufficient documentation

## 2013-08-08 DIAGNOSIS — Z951 Presence of aortocoronary bypass graft: Secondary | ICD-10-CM | POA: Insufficient documentation

## 2013-08-08 DIAGNOSIS — I2510 Atherosclerotic heart disease of native coronary artery without angina pectoris: Secondary | ICD-10-CM | POA: Insufficient documentation

## 2013-08-08 DIAGNOSIS — I252 Old myocardial infarction: Secondary | ICD-10-CM | POA: Insufficient documentation

## 2013-08-08 NOTE — Progress Notes (Signed)
Intermittent resting and exertional blood pressure elevations noted at cardiac rehab. David Hogan continues to exercise without difficulty or complaints. Will fax exercise flow sheets to [name]'s office for review. Exit blood pressure  118/80 today.

## 2013-08-13 ENCOUNTER — Encounter (HOSPITAL_COMMUNITY): Payer: Medicare Other

## 2013-08-13 ENCOUNTER — Encounter (HOSPITAL_COMMUNITY)
Admission: RE | Admit: 2013-08-13 | Discharge: 2013-08-13 | Disposition: A | Discharge status: Home / Self Care | Payer: Medicare Other | Source: Ambulatory Visit | Attending: Cardiology | Admitting: Cardiology

## 2013-08-13 NOTE — Progress Notes (Signed)
Pt informed rehab staff at the end of exercise that Friday will be his last day.  Pt has new co pay for the 2015 insurance of 50.00 per session.  Pt given graduation packet to complete and understands to bring his parking badge back.

## 2013-08-15 ENCOUNTER — Encounter (HOSPITAL_COMMUNITY)
Admission: RE | Admit: 2013-08-15 | Discharge: 2013-08-15 | Disposition: A | Discharge status: Home / Self Care | Payer: Medicare Other | Source: Ambulatory Visit | Attending: Cardiology | Admitting: Cardiology

## 2013-08-15 ENCOUNTER — Encounter (HOSPITAL_COMMUNITY): Payer: Medicare Other

## 2013-08-15 NOTE — Progress Notes (Signed)
Pt graduated today with the completion of 13 exercise session.  Pt opted to discharge early due to co pay 50.00 per session.  Medication list reconciled.  Repeat PHQ2 score 0.  Pt plans to [facility].

## 2013-08-19 ENCOUNTER — Telehealth: Payer: Self-pay | Admitting: Cardiology

## 2013-08-19 NOTE — Telephone Encounter (Signed)
Wants to know if he needs to continue taking Lipitor?

## 2013-08-19 NOTE — Telephone Encounter (Signed)
Yeah - don't want both ACE-I & ARB.  Marykay LexHARDING,DAVID W, MD

## 2013-08-19 NOTE — Telephone Encounter (Signed)
Returned call and pt verified x 2.  Pt wanted to know if he should still be taking lisinopril.  Stated he thought he was told to stop it at some point, but wasn't sure.  RN reviewed chart and lisinopril is not on current med list, but losartan is.  Pt informed lisinopril was dc'd on 9.24.14 by Corine ShelterLuke Kilroy, PA-C in our office.  Pt advised to STOP taking lisinopril now.  Informed Dr. Herbie BaltimoreHarding will be notified for further instructions.  Pt verbalized understanding and agreed w/ plan.    Of note, pt does not have a BP monitor at home an unable to check.  Pt has been going to cardiac rehab and denied issues w/ BP except last Wednesday when BP was a little high.  Pt also requesting Rx for BP monitor.  Message forwarded to Dr. Herbie BaltimoreHarding.  1) (Was) Taking losartan and lisinopril at same time  2) Requested Rx for BP monitor

## 2013-08-20 ENCOUNTER — Encounter (HOSPITAL_COMMUNITY): Payer: Medicare Other

## 2013-08-22 ENCOUNTER — Encounter (HOSPITAL_COMMUNITY): Payer: Medicare Other

## 2013-08-27 ENCOUNTER — Encounter (HOSPITAL_COMMUNITY): Payer: Medicare Other

## 2013-08-29 ENCOUNTER — Other Ambulatory Visit: Payer: Self-pay | Admitting: *Deleted

## 2013-08-29 ENCOUNTER — Telehealth: Payer: Self-pay | Admitting: *Deleted

## 2013-08-29 ENCOUNTER — Encounter (HOSPITAL_COMMUNITY): Payer: Medicare Other

## 2013-08-29 DIAGNOSIS — E785 Hyperlipidemia, unspecified: Secondary | ICD-10-CM

## 2013-08-29 DIAGNOSIS — Z951 Presence of aortocoronary bypass graft: Secondary | ICD-10-CM

## 2013-08-29 DIAGNOSIS — I214 Non-ST elevation (NSTEMI) myocardial infarction: Secondary | ICD-10-CM

## 2013-08-29 DIAGNOSIS — I2510 Atherosclerotic heart disease of native coronary artery without angina pectoris: Secondary | ICD-10-CM

## 2013-08-29 DIAGNOSIS — E669 Obesity, unspecified: Secondary | ICD-10-CM

## 2013-08-29 NOTE — Telephone Encounter (Signed)
Mailed labslip --cmp lipid

## 2013-08-29 NOTE — Telephone Encounter (Signed)
Message copied by Tobin ChadMARTIN, SHARON V. on Fri Aug 29, 2013  6:05 PM ------      Message from: Tobin ChadMARTIN, SHARON V.      Created: Tue Jun 03, 2013 12:33 PM       Mail labslip  In feb 2015      cmp and lipid ------

## 2013-09-03 ENCOUNTER — Encounter (HOSPITAL_COMMUNITY): Payer: Medicare Other

## 2013-09-05 ENCOUNTER — Encounter (HOSPITAL_COMMUNITY): Payer: Medicare Other

## 2013-09-10 ENCOUNTER — Encounter (HOSPITAL_COMMUNITY): Payer: Medicare Other

## 2013-09-12 ENCOUNTER — Encounter (HOSPITAL_COMMUNITY): Payer: Medicare Other

## 2013-10-07 LAB — COMPREHENSIVE METABOLIC PANEL
ALT: 58 U/L — AB (ref 0–53)
AST: 41 U/L — ABNORMAL HIGH (ref 0–37)
Albumin: 4.3 g/dL (ref 3.5–5.2)
Alkaline Phosphatase: 49 U/L (ref 39–117)
BILIRUBIN TOTAL: 1 mg/dL (ref 0.2–1.2)
BUN: 23 mg/dL (ref 6–23)
CO2: 26 mEq/L (ref 19–32)
CREATININE: 1.52 mg/dL — AB (ref 0.50–1.35)
Calcium: 10 mg/dL (ref 8.4–10.5)
Chloride: 104 mEq/L (ref 96–112)
Glucose, Bld: 96 mg/dL (ref 70–99)
Potassium: 3.9 mEq/L (ref 3.5–5.3)
SODIUM: 140 meq/L (ref 135–145)
Total Protein: 7.1 g/dL (ref 6.0–8.3)

## 2013-10-07 LAB — LIPID PANEL
CHOL/HDL RATIO: 4 ratio
CHOLESTEROL: 120 mg/dL (ref 0–200)
HDL: 30 mg/dL — ABNORMAL LOW (ref >39)
LDL Cholesterol: 69 mg/dL (ref 0–99)
TRIGLYCERIDES: 104 mg/dL (ref <150)
VLDL: 21 mg/dL (ref 0–40)

## 2013-10-09 ENCOUNTER — Encounter: Payer: Self-pay | Admitting: Cardiology

## 2013-10-09 ENCOUNTER — Ambulatory Visit: Payer: Self-pay | Admitting: Cardiology

## 2013-10-09 ENCOUNTER — Ambulatory Visit (INDEPENDENT_AMBULATORY_CARE_PROVIDER_SITE_OTHER): Payer: Medicare Other | Admitting: Cardiology

## 2013-10-09 VITALS — BP 140/90 | HR 72 | Ht 69.0 in | Wt 269.0 lb

## 2013-10-09 DIAGNOSIS — E669 Obesity, unspecified: Secondary | ICD-10-CM

## 2013-10-09 DIAGNOSIS — E782 Mixed hyperlipidemia: Secondary | ICD-10-CM

## 2013-10-09 DIAGNOSIS — I10 Essential (primary) hypertension: Secondary | ICD-10-CM

## 2013-10-09 DIAGNOSIS — Z79899 Other long term (current) drug therapy: Secondary | ICD-10-CM

## 2013-10-09 DIAGNOSIS — G4730 Sleep apnea, unspecified: Secondary | ICD-10-CM

## 2013-10-09 DIAGNOSIS — I214 Non-ST elevation (NSTEMI) myocardial infarction: Secondary | ICD-10-CM

## 2013-10-09 DIAGNOSIS — IMO0001 Reserved for inherently not codable concepts without codable children: Secondary | ICD-10-CM

## 2013-10-09 DIAGNOSIS — E785 Hyperlipidemia, unspecified: Secondary | ICD-10-CM

## 2013-10-09 DIAGNOSIS — Z951 Presence of aortocoronary bypass graft: Secondary | ICD-10-CM

## 2013-10-09 DIAGNOSIS — I2510 Atherosclerotic heart disease of native coronary artery without angina pectoris: Secondary | ICD-10-CM

## 2013-10-09 MED ORDER — ATORVASTATIN CALCIUM 40 MG PO TABS: 40.0000 mg | ORAL_TABLET | Freq: Every day | ORAL | Status: DC

## 2013-10-09 MED ORDER — LOSARTAN POTASSIUM 50 MG PO TABS: 50.0000 mg | ORAL_TABLET | Freq: Every day | ORAL | Status: DC

## 2013-10-09 MED ORDER — NITROGLYCERIN 0.4 MG SL SUBL: 0.4000 mg | SUBLINGUAL_TABLET | SUBLINGUAL | Status: DC | PRN

## 2013-10-09 NOTE — Patient Instructions (Signed)
INCREASE LOSARTAN 50 MG ONCE A DAY ( MAY DOUBLE 25 MG  TABLETS UNTIL BOTTLE IS EMPTY)  DECREASE ATORVASTATIN TO 40 MG  (LIPITOR)  ONCE A DAY -   LAB IN 6 MONTH --nmr liprofile -lipids, CMP  Your physician wants you to follow-up in 6 MONTHS DR HARDING.  You will receive a reminder letter in the mail two months in advance. If you don't receive a letter, please call our office to schedule the follow-up appointment.

## 2013-10-10 ENCOUNTER — Encounter: Payer: Self-pay | Admitting: Cardiology

## 2013-10-10 ENCOUNTER — Telehealth: Payer: Self-pay | Admitting: *Deleted

## 2013-10-10 DIAGNOSIS — Z79899 Other long term (current) drug therapy: Secondary | ICD-10-CM

## 2013-10-10 NOTE — Telephone Encounter (Signed)
Message copied by Chauncey ReadingSCOTT, Terryn Rosenkranz R on Fri Oct 10, 2013 11:10 AM ------      Message from: Marykay LexHARDING, DAVID W      Created: Fri Oct 10, 2013 12:54 AM       Cholesterol looks a better overall.  I am concerned about the renal function -- Creatinine is up a bit.            Needs to hydrate well over the weekend & Hold NSAID medications.  Would probably recheck next week.            Marykay LexHARDING,DAVID W, MD            Am forwarding to [facility] since Jasmine DecemberSharon is out out of office Friday,            Marykay LexHARDING,DAVID W, MD      .       ------

## 2013-10-10 NOTE — Telephone Encounter (Signed)
Call to pt and verified x 2.  Results given per MD.  Pt advised to hold diclofenac and ASA over the weekend and restart ASA on Monday.  Increase water intake over the weekend and recheck labs on Monday.  Pt verbalized understanding and agreed w/ plan.  BMP ordered and pt aware he will present to lab to have drawn.

## 2013-10-11 ENCOUNTER — Encounter: Payer: Self-pay | Admitting: Cardiology

## 2013-10-11 NOTE — Assessment & Plan Note (Signed)
Notable improvement in lipids from September. He is on statin plus fenofibrate which makes me concerned for possible myalgias with high-dose of Lipitor. We will decrease to 40 mg.  Recheck lipids in 6 months with a NMR panel.

## 2013-10-11 NOTE — Progress Notes (Signed)
PCP: Aquilla Hacker, MD  Clinic Note: Chief Complaint  Patient presents with  . 5 month visit    no chest pain @ 3 weeks  woke up  "pin prick from the inside lft chest " lasting 20 mins -;, no sob , no edema,  rehab at [facility] now   HPI: David Hogan is a 68 y.o. male with a Cardiovascular Problem List below who presents today for 5-6 month followup. I last saw him in followup from his CABG.  I first met him in September when he presented with a syncopal episode that occurred in response to what seemed to be a combination of heat exhaustion and multiple bee /yellow jacket stings. She has enough he was found to have positive troponin levels consistent with non-STEMI. Probably related to hypotension with existing coronary disease.  Due to the positive troponins he is in the Cath Lab and found to have multivessel CAD, and went for CABG the next day. He has been doing relatively well since the CABG.  Interval History: He comes in today doing relatively well overall. But he does note at least one episode 3 days ago when he was awakened by a sharp "pinprick "tight pain and left-sided his chest that lasted about 15-20 minutes. He did take nitroglycerin but did not notice any relief initially. The symptom has not recurred at all with any type of exertion.  He is continuing to do maintenance rehabilitation based on his postop cardiac rehabilitation program, having joined the [facility].  Unfortunately, he has gained back the weight that he lost while in the hospital. He admits to dietary indiscretion over the Christmas holidays.  Besides the episode noted above, has been relatively stable from a cardiac standpoint. Otherwise, no chest pain or shortness of breath with rest or exertion. No PND, orthopnea or edema. No palpitations, lightheadedness, dizziness, weakness or syncope/near syncope. No TIA/amaurosis fugax symptoms. No melena, hematochezia hematuria.  He continues to use CPAP for his OSA.  Past  Medical History  Diagnosis Date  . Asthma   . Complication of anesthesia     "woke up wild in recovery after gallbladder OR" (04/08/2013)  . Hypertension   . Heart murmur   . Exertional shortness of breath   . OSA on CPAP   . 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 History of: Non-STEMI (non-ST elevated myocardial infarction) 04/08/2013    Down in the setting of syncope in reaction to heat exhaustion plus multiple bee stings   Prior Cardiac Evaluation and Past Surgical History: Past Surgical History  Procedure Laterality Date  . Coronary artery bypass graft N/A 04/11/2013    Procedure: CORONARY ARTERY BYPASS GRAFTING (CABGx3: LIMA-LAD, SVG-RI, SVG-D1) ;  Surgeon: Grace Isaac, MD;  Location: [place];  Service: Open Heart Surgery;  Laterality: N/A;  x3 using right greater saphenous vein and left internal mammary.  . Intraoperative transesophageal echocardiogram N/A 04/11/2013    Procedure: INTRAOPERATIVE TRANSESOPHAGEAL ECHOCARDIOGRAM;  Surgeon: Grace Isaac, MD;  Location: [place];  Service: Open Heart Surgery;  Laterality: N/A;  . Transthoracic echocardiogram  04/09/2013    EF 65-70%. Normal wall motion. Grade 1 diastolic dysfunction. Aortic sclerosis but no stenosis.  . Cardiac catheterization  04/09/2013    Ostial and proximal LAD involving D1 80-90% stenoses, ramus intermedius proximal to ostial 90%. Otherwise mild diffuse disease in the circumflex and RCA just region.   MEDICATIONS AND  ALLERGIES REVIEWED IN EPIC No Change in Social and Family History  ROS: A comprehensive Review of Systems - Negative except Mild aches and pains in his joints, but no other symptoms.  PHYSICAL EXAM BP 140/90  Pulse 72  Ht _0  (1.753 m)  Wt 269 lb (122.018 kg)  BMI 39.71 kg/m2 General appearance: A&O x3, NAD, Cooperative, appears stated age; moderate to severely obese. Well groomed. Pleasant mood and affect  the Neck: Thick, with no LAN, carotid bruit or JVD Lungs: CTA B., normal percussion bilaterally and non-labored Heart: RRR, S1, S2 normal, no murmur, click, rub or gallop; unable to palpate PMI due to body habitus Abdomen: soft, non-tender; bowel sounds normal; no masses,  no organomegaly; probable obesity Extremities: extremities normal, atraumatic, no cyanosis, and trace edema Pulses: 2+ and symmetric Neurologic: Mental status: Alert, oriented, thought content appropriate Cranial nerves: normal (II-XII grossly intact)  OTL:[id number] today: Yes Rate: 72 , Rhythm: NSR, PAC; otherwise normal EKG/normal axis, normal intervals  Recent Labs 10/08/2011:   DC 120, HDL 30, LDL 69, TG 104 -- this represents a relatively significant improvement from September. The only description point is the drop in HDL.  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 I don't think it is cardiac related as much potentially be rapid in its improvement of symptoms. I do want to monitor for any additional symptoms, and if there are any would have a low threshold for her Ordering a Myoview Stress Test. This would give [place] a baseline after his CABG.  He is on aspirin statin as well as an ARB and beta blocker. He does not have sublingual nitroglycerin refills. We will then refill that for him.  History of NSTEMI (non-ST elevated myocardial infarction): Type II MI No sign of wall motion and melena your scar that would suggest a significant infarction. This is probably a Type II MI.  Dyslipidemia, goal LDL below 70 Notable improvement in lipids from September. He is on statin plus fenofibrate which makes me concerned for possible myalgias with high-dose of Lipitor. We will decrease to 40 mg.  Recheck lipids in 6 months with a NMR panel.  HTN (hypertension), benign Again his blood  pressure is a little elevated. I think with his overall size were fine with increasing his losartan to 50 mg. The next step would be to increase the Lopressor to 50 mg twice a day, but I am reluctant to do that until I know for sure he is doing well with exercise.  Obesity, Class II, BMI 35-39.9, with comorbidity I was quite disappointing to see the weight gain. At least he is continuing to do his exercise. We talked again about dietary modifications and ensuring that he avoids excess fatty foods and starchy foods. I cannot went back over the concept of the DASH diet. Hopefully with some weight loss we can make up the difference in reducing the statin dose.  Sleep apnea- on C-pap He continues to be compliant with his CPAP. Tolerating well    Orders Placed This Encounter  Procedures  . NMR Lipoprofile with Lipids    Standing Status: Future     Number of Occurrences:      Standing Expiration Date: 10/10/2014  . Comprehensive metabolic panel    Standing Status: Future     Number of Occurrences:      Standing Expiration Date:  10/10/2014    Order Specific Question:  Has the patient fasted?    Answer:  Yes  . EKG 12-Lead   Meds ordered this encounter  Medications  . losartan (COZAAR) 50 MG tablet    Sig: Take 1 tablet (50 mg total) by mouth daily.    Dispense:  90 tablet    Refill:  3  . atorvastatin (LIPITOR) 40 MG tablet    Sig: Take 1 tablet (40 mg total) by mouth daily.    Dispense:  90 tablet    Refill:  3  . nitroGLYCERIN (NITROSTAT) 0.4 MG SL tablet    Sig: Place 1 tablet (0.4 mg total) under the tongue every 5 (five) minutes as needed for chest pain.    Dispense:  25 tablet    Refill:  6    Followup: 6 months  DAVID W. Ellyn Hack, M.D., M.S. Interventional Cardiologist [facility]

## 2013-10-11 NOTE — Assessment & Plan Note (Signed)
No sign of wall motion and melena your scar that would suggest a significant infarction. This is probably a Type II MI.

## 2013-10-11 NOTE — Assessment & Plan Note (Signed)
Again his blood pressure is a little elevated. I think with his overall size were fine with increasing his losartan to 50 mg. The next step would be to increase the Lopressor to 50 mg twice a day, but I am reluctant to do that until I know for sure he is doing well with exercise.

## 2013-10-11 NOTE — Assessment & Plan Note (Addendum)
He did have one episode of chest discomfort that was relieved about 10 minutes after taking a nitroglycerin. I don't think it is cardiac related as much potentially be rapid in its improvement of symptoms. I do want to monitor for any additional symptoms, and if there are any would have a low threshold for her Ordering a Myoview Stress Test. This would give us a baseline after his CABG.  He is on aspirin statin as well as an ARB and beta blocker. He does not have sublingual nitroglycerin refills. We will then refill that for him.

## 2013-10-11 NOTE — Assessment & Plan Note (Signed)
I was quite disappointing to see the weight gain. At least he is continuing to do his exercise. We talked again about dietary modifications and ensuring that he avoids excess fatty foods and starchy foods. I cannot went back over the concept of the DASH diet. Hopefully with some weight loss we can make up the difference in reducing the statin dose.

## 2013-10-11 NOTE — Assessment & Plan Note (Signed)
He continues to be compliant with his CPAP. Tolerating well

## 2013-10-15 ENCOUNTER — Telehealth: Payer: Self-pay | Admitting: Cardiology

## 2013-10-15 NOTE — Telephone Encounter (Signed)
Returning call,he thinks it was [facility] doctor[facility]mber.

## 2013-10-15 NOTE — Telephone Encounter (Signed)
Left message to call back  

## 2013-10-15 NOTE — Telephone Encounter (Signed)
Patient return call. Labs were done today. He want results. RN informed patient that results are not available.will call when available

## 2013-10-16 LAB — BASIC METABOLIC PANEL
BUN: 21 mg/dL (ref 6–23)
CALCIUM: 9.6 mg/dL (ref 8.4–10.5)
CO2: 27 mEq/L (ref 19–32)
CREATININE: 1.12 mg/dL (ref 0.50–1.35)
Chloride: 102 mEq/L (ref 96–112)
GLUCOSE: 104 mg/dL — AB (ref 70–99)
Potassium: 3.7 mEq/L (ref 3.5–5.3)
SODIUM: 140 meq/L (ref 135–145)

## 2013-10-17 ENCOUNTER — Telehealth: Payer: Self-pay | Admitting: *Deleted

## 2013-10-17 NOTE — Telephone Encounter (Signed)
I would prefer that he does not use a combination of diclofenac and aspirin. If he is taking standing dose of diclofenac, this would preclude the need for aspirin and doing some things. However if he is not taking the diclofenac on a standing basis I would simply have diclofenac be in lieu of aspirin.  This is not ideal, however the diclofenac is likely necessary for his ability to exercise.

## 2013-10-17 NOTE — Telephone Encounter (Signed)
Spoke to patient. BMP Result given . Verbalized understanding Patient has a question can he restart Diclofenac and Aspirin back ?  RN informed patient will defer to Dr Herbie BaltimoreHARDING ,can restart Aspirin  ,will call patient back next week ,patient verbalized understanding..[name]

## 2013-10-17 NOTE — Telephone Encounter (Signed)
Message copied by Tobin ChadMARTIN, Belynda Pagaduan V. on Fri Oct 17, 2013 10:34 AM ------      Message from: Marykay LexHARDING, DAVID W      Created: Fri Oct 17, 2013  9:44 AM       Labs look pretty good. Renal function is much improved. Still awaiting NMR panel            HARDING,DAVID W, MD       ------

## 2013-10-20 NOTE — Telephone Encounter (Signed)
Per to Dr Herbie BaltimoreHarding , patient should no use both medication. Use only one.   Spoke to patient . He states he does not use any thing for discomfort, he has not use diclofenac ,but only once since leaving the hospital.  per  Dr Herbie Baltimoreharding , may use aspirin daily if he haa to use diclofenac, do not take aspirin. Patient verbalized understanding.

## 2013-10-23 ENCOUNTER — Telehealth: Payer: Self-pay | Admitting: Cardiology

## 2013-10-23 NOTE — Telephone Encounter (Signed)
Please call-question about his medicine.

## 2013-10-23 NOTE — Telephone Encounter (Signed)
Returned call and pt verified x 2.  Pt stated he was told to stop taking ASA and diclofenac the other week when he came in for his test.  Pt wanted to know if he should be taking both.  RN reviewed phone note from 3.13.15 and read [name]'s note to pt.  Pt informed he should NOT be taking both together and can take either one daily, but not together.  Pt stated he has about 20 pills of diclofenac left and he will finish them and then go back to taking the ASA 81 mg.  Pt informed that is fine and also reminded to take w/ food and monitor for black, tarry stools which indicate upper GI bleed.  Pt verbalized understanding and agreed w/ plan.

## 2013-10-23 NOTE — Telephone Encounter (Signed)
Returning your call. °

## 2013-10-23 NOTE — Telephone Encounter (Signed)
Returned call.  Left message to call back before 4pm and to leave a message r/t concerns/questions.

## 2013-10-23 NOTE — Telephone Encounter (Signed)
Returned call.  Left message to call back before 4pm or tomorrow between 8am and 4pm. 

## 2013-12-17 ENCOUNTER — Telehealth: Payer: Self-pay | Admitting: Cardiology

## 2013-12-17 NOTE — Telephone Encounter (Signed)
Please call,he feels tired all the time and is retaining a lot of fluid.

## 2013-12-18 NOTE — Telephone Encounter (Signed)
Over the past several weeks has had a difficult time working outside due to heat.  Becomes tired easily.  States he loses 2 lbs and then gains it right back.  Discussed his diet with him and he is trying to stay clear of high sodium foods.  States he is not swelling but may have some abdominal bloating.  Suggested he change his diet to more protein, fruits and vegetables and stay clear of breads, pasta, processed foods and fast foods.  He does have asthma so he thinks this may be some of his problems.  Suggested he not over do outside activities especially when it is hot and humid.  Will have scheduling make an appt with Dr. Herbie BaltimoreHarding and his next available.  Told to call if he starts feeling any worse.  Patient voiced understanding.

## 2013-12-25 ENCOUNTER — Telehealth: Payer: Self-pay | Admitting: Cardiology

## 2013-12-25 NOTE — Telephone Encounter (Signed)
Closed encounter °

## 2014-02-02 ENCOUNTER — Ambulatory Visit: Payer: Medicare Other | Admitting: Cardiology

## 2014-02-11 ENCOUNTER — Ambulatory Visit (INDEPENDENT_AMBULATORY_CARE_PROVIDER_SITE_OTHER): Payer: Medicare Other | Admitting: Cardiology

## 2014-02-11 ENCOUNTER — Encounter: Payer: Self-pay | Admitting: Cardiology

## 2014-02-11 VITALS — BP 140/72 | HR 81 | Ht 68.0 in | Wt 261.6 lb

## 2014-02-11 DIAGNOSIS — I214 Non-ST elevation (NSTEMI) myocardial infarction: Secondary | ICD-10-CM

## 2014-02-11 DIAGNOSIS — E785 Hyperlipidemia, unspecified: Secondary | ICD-10-CM

## 2014-02-11 DIAGNOSIS — I2510 Atherosclerotic heart disease of native coronary artery without angina pectoris: Secondary | ICD-10-CM

## 2014-02-11 DIAGNOSIS — Z79899 Other long term (current) drug therapy: Secondary | ICD-10-CM

## 2014-02-11 DIAGNOSIS — Z951 Presence of aortocoronary bypass graft: Secondary | ICD-10-CM

## 2014-02-11 DIAGNOSIS — I10 Essential (primary) hypertension: Secondary | ICD-10-CM

## 2014-02-11 DIAGNOSIS — IMO0001 Reserved for inherently not codable concepts without codable children: Secondary | ICD-10-CM

## 2014-02-11 NOTE — Patient Instructions (Signed)
Will send you lab slips in Sept /Oct 2015   (NMR WITH LIPIDS ,CMP0  Continue with weight loss   Your physician wants you to follow-up in DEC/JAN 2015-16 DR HARDING.  You will receive a reminder letter in the mail two months in advance. If you don't receive a letter, please call our office to schedule the follow-up appointment.

## 2014-02-13 NOTE — Assessment & Plan Note (Addendum)
Much better labs checked in March. He is almost at goal overall with the exception of HDL still being low. This can only be really helped by exercise and weight loss. Continue statin at current dose and exercise. Plan was to check MMR R. to followup in 6 months. We will check him in about September timeframe, which will be 1 year out from his true

## 2014-02-13 NOTE — Assessment & Plan Note (Signed)
Slightly elevated blood pressure today was systolic of 140. Overall though has been relatively well-controlled. Next step for better blood pressure control be to increase his beta blocker to 50 mg twice a day then re-titrate the ARB dose. Since he is doing well with exercise and will be helpful when he checks it during his exercise, I'm going to leave things alone for now.

## 2014-02-13 NOTE — Assessment & Plan Note (Signed)
Doing relatively well post CABG. I don't think the chest discomfort symptoms he is having are all anginal in nature. He's not having them with his exertion. He actually feels much better with exertion than he had preoperatively. He is on aspirin, statin plus fenofibrate as well as beta blocker and ARB. No changes required to his regimen

## 2014-02-13 NOTE — Progress Notes (Signed)
PCP: Aquilla Hacker, MD  Clinic Note: Chief Complaint  Patient presents with  . 6 month visit    no chest pain ,sob, no edema   HPI: David Hogan is a 68 y.o. male with a Cardiovascular Problem List below who presents today for 5-6 month followup. I last saw him in followup from his CABG.  I first met him in September 2014 when he presented with a syncopal episode that occurred in response to what seemed to be a combination of heat exhaustion and multiple bee /yellow jacket stings. She has enough he was found to have positive troponin levels consistent with non-STEMI. Probably related to hypotension with existing coronary disease.  Due to the positive troponins he is in the Cath Lab and found to have multivessel CAD, and went for CABG the next day. He has been doing relatively well since the CABG. He was last seen in March & the main focus was on weight loss, since he had regained his peri-op weight & more.  Interval History: He comes in today doing relatively well overall. But he continues to note intermittent sharp "pinprick /tight" pain on the Left side of hischest that are notably shorter in duration (a few minutes) -- not at all associated with exertion, usually occur at rest or when lying down.   He did take nitroglycerin but did not notice any relief initially.  He is continuing to do maintenance rehabilitation based on his postop cardiac rehabilitation program, having joined the [facility] & has lost back 8 of the 12-13 lbs that he had gained over the holiday season.  He actually is going to go to the cardiac rehabilitation center because he missed classes during his time in cardiac rehabilitation to see if he can at least attend the nutrition classes along with her. If not I'll be happy to refer him to nutrition consultant through the hospital. Sizing at least 35-40 minutes a day usually also power doing treadmill and stair climber's elliptical. 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 exhaustion that symptoms but denied any chest tightness or pressure or notable dyspnea just some nausea.  Besides the episode noted above, has been relatively stable from a cardiac standpoint. Otherwise, no chest pain or shortness of breath with rest or exertion. No PND, orthopnea or edema. No palpitations, lightheadedness, dizziness, weakness or syncope/near syncope. No TIA/amaurosis fugax symptoms. No melena, hematochezia hematuria.  He continues to use CPAP for his OSA.  Past Medical History  Diagnosis Date  . Asthma   . Complication of anesthesia     "woke up wild in recovery after gallbladder OR" (04/08/2013)  . Hypertension   . Heart murmur   . Exertional shortness of breath   . OSA on CPAP   . 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 History of: Non-STEMI (non-ST elevated myocardial infarction) 04/08/2013    Down in the setting of syncope in reaction to heat exhaustion plus multiple bee stings   Prior Cardiac Evaluation and Past Surgical History: Past Surgical History  Procedure Laterality Date  . Coronary artery bypass graft N/A 04/11/2013    Procedure: CORONARY ARTERY BYPASS GRAFTING (CABGx3: LIMA-LAD, SVG-RI, SVG-D1) ;  Surgeon: Grace Isaac, MD;  Location: [place];  Service: Open Heart Surgery;  Laterality: N/A;  x3 using right greater saphenous vein and  left internal mammary.  . Intraoperative transesophageal echocardiogram N/A 04/11/2013    Procedure: INTRAOPERATIVE TRANSESOPHAGEAL ECHOCARDIOGRAM;  Surgeon: Grace Isaac, MD;  Location: [place];  Service: Open Heart Surgery;  Laterality: N/A;  . Transthoracic echocardiogram  04/09/2013    EF 65-70%. Normal wall motion. Grade 1 diastolic dysfunction. Aortic sclerosis but no stenosis.  . Cardiac catheterization  04/09/2013    Ostial and proximal LAD involving D1 80-90%  stenoses, ramus intermedius proximal to ostial 90%. Otherwise mild diffuse disease in the circumflex and RCA just region.   MEDICATIONS AND ALLERGIES REVIEWED IN EPIC No Change in Social and Family History  ROS: A comprehensive Review of Systems - Negative except Mild aches and pains in his joints, but no other symptoms.  PHYSICAL EXAM BP 140/72  Pulse 81  Ht _0  (1.727 m)  Wt 261 lb 9.6 oz (118.661 kg)  BMI 39.79 kg/m2 General appearance: A&O x3, NAD, Cooperative, appears stated age; moderately obese. Well groomed. Pleasant mood and affect the Neck: Thick, with no LAN, carotid bruit or JVD Lungs: CTA B., normal percussion bilaterally and non-labored Heart: RRR, S1, S2 normal, no murmur, click, rub or gallop; unable to palpate PMI due to body habitus Abdomen: soft, non-tender; bowel sounds normal; no masses,  no organomegaly; probable obesity Extremities: extremities normal, atraumatic, no cyanosis, and trace edema Pulses: 2+ and symmetric Neurologic: Mental status: Alert, oriented, thought content appropriate Cranial nerves: normal (II-XII grossly intact)  [id number] today: Yes Rate: 81 , Rhythm: NSR, PAC; otherwise normal EKG/normal axis, normal intervals; no notable change  Recent Labs 10/08/2011: Reviewed in last note.  ASSESSMENT / PLAN: CAD, multiple vessel not amenable to PCI at cath 04/09/13 Doing relatively well post CABG. I don't think the chest discomfort symptoms he is having are all anginal in nature. He's not having them with his exertion. He actually feels much better with exertion than he had preoperatively. He is on aspirin, statin plus fenofibrate as well as beta blocker and ARB. No changes required to his regimen  HTN (hypertension), benign Slightly elevated blood pressure today was systolic of 546. Overall though has been relatively well-controlled. Next step for better blood pressure control be to increase his beta blocker to 50 mg twice a day then re-titrate  the ARB dose. Since he is doing well with exercise and will be helpful when he checks it during his exercise, I'm going to leave things alone for now.  Dyslipidemia, goal LDL below 70 Much better labs checked in March. He is almost at goal overall with the exception of HDL still being low. This can only be really helped by exercise and weight loss. Continue statin at current dose and exercise. Plan was to check MMR R. to followup in 6 months. We will check him in about September timeframe, which will be 1 year out from his true  Obesity, Class II, BMI 35-39.9, with comorbidity He is still not happy but I was able to lose 8 pounds. While this did not quite meet the goals we had set for him, it is definitely start in the right direction.  We readdressed the concept of a 2-3 pound weight loss per month. He is hoping to get into the nutrition classes from cardiac rehabilitation, if not would strongly consider a nutrition consult.    Orders Placed This Encounter  Procedures  . EKG 12-Lead     Followup: 6 months  DAVID W. Ellyn Hack, M.D., M.S. Interventional Cardiologist [facility]

## 2014-02-13 NOTE — Assessment & Plan Note (Signed)
He is still not happy but I was able to lose 8 pounds. While this did not quite meet the goals we had set for him, it is definitely start in the right direction.  We readdressed the concept of a 2-3 pound weight loss per month. He is hoping to get into the nutrition classes from cardiac rehabilitation, if not would strongly consider a nutrition consult.

## 2014-03-03 ENCOUNTER — Telehealth: Payer: Self-pay | Admitting: Cardiology

## 2014-03-03 NOTE — Telephone Encounter (Signed)
[facility] to [facility]  Patient aware will defer to Dr Herbie BaltimoreHarding

## 2014-03-03 NOTE — Telephone Encounter (Signed)
Pleae call,[facility]ring [facility],need to ask some questions.

## 2014-03-04 NOTE — Telephone Encounter (Signed)
That is fine  HARDING,DAVID W, MD  

## 2014-03-04 NOTE — Telephone Encounter (Signed)
Notified patient it was okay Verbalized understanding.

## 2014-03-17 ENCOUNTER — Telehealth: Payer: Self-pay | Admitting: *Deleted

## 2014-03-17 DIAGNOSIS — E782 Mixed hyperlipidemia: Secondary | ICD-10-CM

## 2014-03-17 DIAGNOSIS — Z79899 Other long term (current) drug therapy: Secondary | ICD-10-CM

## 2014-03-17 NOTE — Telephone Encounter (Signed)
Lab slip mailed.

## 2014-03-17 NOTE — Telephone Encounter (Signed)
Mailed letter and labslip--CMP, lipid

## 2014-03-17 NOTE — Telephone Encounter (Signed)
Message copied by Tobin ChadMARTIN, SHARON V. on Tue Mar 17, 2014 10:51 AM ------      Message from: Tobin ChadMARTIN, SHARON V.      Created: Thu Oct 09, 2013  5:17 PM       CMP, LIPID      MAIL IN AUG 2015 ------

## 2014-03-24 LAB — COMPREHENSIVE METABOLIC PANEL
ALK PHOS: 42 U/L (ref 39–117)
ALT: 47 U/L (ref 0–53)
AST: 34 U/L (ref 0–37)
Albumin: 4.2 g/dL (ref 3.5–5.2)
BUN: 17 mg/dL (ref 6–23)
CO2: 25 mEq/L (ref 19–32)
Calcium: 9.5 mg/dL (ref 8.4–10.5)
Chloride: 107 mEq/L (ref 96–112)
Creat: 1.23 mg/dL (ref 0.50–1.35)
Glucose, Bld: 98 mg/dL (ref 70–99)
Potassium: 3.7 mEq/L (ref 3.5–5.3)
SODIUM: 142 meq/L (ref 135–145)
TOTAL PROTEIN: 6.8 g/dL (ref 6.0–8.3)
Total Bilirubin: 0.8 mg/dL (ref 0.2–1.2)

## 2014-03-25 LAB — NMR LIPOPROFILE WITH LIPIDS
Cholesterol, Total: 116 mg/dL (ref <200)
HDL Particle Number: 26.8 umol/L — ABNORMAL LOW (ref >=30.5)
HDL Size: 8 nm — ABNORMAL LOW (ref >=9.2)
HDL-C: 28 mg/dL — AB (ref >=40)
LDL CALC: 67 mg/dL (ref <100)
LDL Particle Number: 999 nmol/L (ref <1000)
LDL Size: 19.9 nm — ABNORMAL LOW (ref >20.5)
LP-IR Score: 68 — ABNORMAL HIGH (ref <=45)
Large VLDL-P: 2.7 nmol/L (ref <=2.7)
SMALL LDL PARTICLE NUMBER: 873 nmol/L — AB (ref <=527)
Triglycerides: 105 mg/dL (ref <150)
VLDL Size: 47.5 nm — ABNORMAL HIGH (ref <=46.6)

## 2014-03-26 ENCOUNTER — Telehealth: Payer: Self-pay | Admitting: *Deleted

## 2014-03-26 NOTE — Telephone Encounter (Signed)
Message copied by Tobin ChadMARTIN, SHARON V. on Thu Mar 26, 2014  5:56 PM ------      Message from: Marykay LexHARDING, DAVID W      Created: Tue Mar 24, 2014 10:56 PM       Normal Chem panel with LFTs & renal function.  High normal glucose.            Marykay LexHARDING,DAVID W, MD       ------

## 2014-03-26 NOTE — Telephone Encounter (Signed)
LEFT MESSAGE TO CALL BACK ABOUT LABS

## 2014-05-04 ENCOUNTER — Telehealth: Payer: Self-pay | Admitting: Cardiology

## 2014-05-05 NOTE — Telephone Encounter (Signed)
Closed encounter °

## 2014-07-01 ENCOUNTER — Other Ambulatory Visit: Payer: Self-pay | Admitting: Cardiology

## 2014-07-13 ENCOUNTER — Ambulatory Visit: Payer: Medicare Other | Admitting: Cardiology

## 2014-07-16 ENCOUNTER — Encounter (HOSPITAL_COMMUNITY): Payer: Self-pay | Admitting: Cardiology

## 2014-08-03 ENCOUNTER — Other Ambulatory Visit: Payer: Self-pay | Admitting: Cardiology

## 2014-08-03 NOTE — Telephone Encounter (Signed)
Rx(s) sent to pharmacy electronically.  

## 2014-09-16 ENCOUNTER — Encounter: Payer: Self-pay | Admitting: Cardiology

## 2014-09-16 ENCOUNTER — Ambulatory Visit (INDEPENDENT_AMBULATORY_CARE_PROVIDER_SITE_OTHER): Payer: Medicare Other | Admitting: Cardiology

## 2014-09-16 VITALS — BP 128/76 | HR 83 | Ht 70.0 in | Wt 266.7 lb

## 2014-09-16 DIAGNOSIS — Z79899 Other long term (current) drug therapy: Secondary | ICD-10-CM

## 2014-09-16 DIAGNOSIS — I4729 Other ventricular tachycardia: Secondary | ICD-10-CM

## 2014-09-16 DIAGNOSIS — I10 Essential (primary) hypertension: Secondary | ICD-10-CM

## 2014-09-16 DIAGNOSIS — E785 Hyperlipidemia, unspecified: Secondary | ICD-10-CM

## 2014-09-16 DIAGNOSIS — I472 Ventricular tachycardia: Secondary | ICD-10-CM

## 2014-09-16 DIAGNOSIS — IMO0001 Reserved for inherently not codable concepts without codable children: Secondary | ICD-10-CM

## 2014-09-16 DIAGNOSIS — I214 Non-ST elevation (NSTEMI) myocardial infarction: Secondary | ICD-10-CM

## 2014-09-16 DIAGNOSIS — G4730 Sleep apnea, unspecified: Secondary | ICD-10-CM

## 2014-09-16 DIAGNOSIS — I2510 Atherosclerotic heart disease of native coronary artery without angina pectoris: Secondary | ICD-10-CM

## 2014-09-16 NOTE — Patient Instructions (Signed)
Dr Herbie BaltimoreHarding has ordered the following test(s) to be done: 1. Blood work - to be done FASTING prior to your next visit with Dr Herbie BaltimoreHarding  Dr Herbie BaltimoreHarding wants you to follow-up in 6 months. You will receive a reminder letter in the mail two months in advance. If you don't receive a letter, please call our office to schedule the follow-up appointment.  Your physician encouraged you to lose weight for better health.

## 2014-09-18 ENCOUNTER — Encounter: Payer: Self-pay | Admitting: Cardiology

## 2014-09-18 NOTE — Assessment & Plan Note (Signed)
No recurrent events. On beta blocker.

## 2014-09-18 NOTE — Progress Notes (Signed)
PCP: Ardelle Balls, MD  Clinic Note: Chief Complaint  Patient presents with  . ROV 6 months    patient reports shortness of breath on exertion, otherwise, no complaints    HPI: David Hogan is a 69 y.o. male with a PMH below who presents today for six-month followup of CAD status post CABG. As you recall he is a very visiting presentation with syncope in the setting of what sounded like heat exhaustion complicated by multiple bee stings (September 2014). He then seemed to have had a reaction to bee stings and suffered a mild non-STEMI. Because of his resected using a cardiac catheterization about a multivessel CAD and was taken for CABG the following day. He still wears a hat with the Bees on it & the quote "Who New?"  Past Medical History  Diagnosis Date  . Asthma   . Complication of anesthesia     "woke up wild in recovery after gallbladder OR" (04/08/2013)  . Hypertension   . Heart murmur   . Exertional shortness of breath   . OSA on CPAP   . 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 History of: Non-STEMI (non-ST elevated myocardial infarction) 04/08/2013    Down in the setting of syncope in reaction to heat exhaustion plus multiple bee stings    Prior Cardiac Evaluation and Past Surgical History: Past Surgical History  Procedure Laterality Date  . Shoulder open rotator cuff repair Bilateral 1990's-2000's  . Incision and drainage of wound  ~ 1996    "hit elbow at work; it swelled up; had it opened up to get pus out" (04/08/2013)  . Cholecystectomy  ~ 2010  . Hernia repair  ~ 2012    "from the gallbladder OR" (04/08/2013)  . Carpal tunnel release Left 1990's  . Coronary artery bypass graft N/A 04/11/2013    Procedure: CORONARY ARTERY BYPASS GRAFTING (CABGx3: LIMA-LAD, SVG-RI, SVG-D1) ;  Surgeon: Delight Ovens, MD;  Location: [facility] OR;  Service: Open Heart Surgery;  Laterality: N/A;  x3 using  right greater saphenous vein and left internal mammary.  . Intraoperative transesophageal echocardiogram N/A 04/11/2013    Procedure: INTRAOPERATIVE TRANSESOPHAGEAL ECHOCARDIOGRAM;  Surgeon: Delight Ovens, MD;  Location: [facility] OR;  Service: Open Heart Surgery;  Laterality: N/A;  . Transthoracic echocardiogram  04/09/2013    EF 65-70%. Normal wall motion. Grade 1 diastolic dysfunction. Aortic sclerosis but no stenosis.  . Cardiac catheterization  04/09/2013    Ostial and proximal LAD involving D1 80-90% stenoses, ramus intermedius proximal to ostial 90%. Otherwise mild diffuse disease in the circumflex and RCA just region.  . Left heart catheterization with coronary angiogram N/A 04/09/2013    Procedure: LEFT HEART CATHETERIZATION WITH CORONARY ANGIOGRAM;  Surgeon: Marykay Lex, MD;  Location: [facility] CATH LAB;  Service: Cardiovascular;  Laterality: N/A;    Interval History: David Hogan continues to do very well overall from a cardiac standpoint. He sheepishly balances that he is not been as good as it would help with his weight loss. He just doesn't do that also exercise and is not as good as it actually exercises from the table as far as eating goes well - he does get short of breath with exertion, and worse with his chronic congestion.. He has not had any recurrent anginal symptoms with rest or exertion. No signs or symptoms of CHF such as PND, orthopnea or edema. He does sleep with a  CPAP, and notes that really helps his chronic sinus congestion. He has never really had much in the way of any palpitations or rapid irregular heartbeats. No syncope/near syncope or TIA/amaurosis fugax. He has not had signs of the peripheral vascular disease with claudication. No headaches or blurred vision a period  ROS: A comprehensive was performed. Review of Systems  Constitutional: Negative for weight loss.  HENT: Positive for congestion. Negative for nosebleeds.   Respiratory: Negative for cough.   Gastrointestinal:  Negative for heartburn, blood in stool and melena.  Genitourinary: Negative for hematuria.  Musculoskeletal: Positive for joint pain (Mild arthritis). Negative for myalgias.  Neurological: Negative for dizziness.  Endo/Heme/Allergies: Does not bruise/bleed easily.  Psychiatric/Behavioral: Negative.   All other systems reviewed and are negative.   Current Outpatient Prescriptions on File Prior to Visit  Medication Sig Dispense Refill  . aspirin EC 81 MG tablet Take 1 tablet (81 mg total) by mouth daily. 90 tablet 3  . atorvastatin (LIPITOR) 80 MG tablet TAKE ONE TABLET BY MOUTH EVERY DAY AT 6PM 30 tablet 11  . budesonide-formoterol (SYMBICORT) 80-4.5 MCG/ACT inhaler Inhale 2 puffs into the lungs 2 (two) times daily.    [name] EPIPEN 2-PAK 0.3 MG/0.3ML SOAJ injection     . fenofibrate 160 MG tablet TAKE ONE TABLET BY MOUTH EVERY DAY 30 tablet 11  . fluticasone (FLONASE) 50 MCG/ACT nasal spray Place 2 sprays into the nose daily.    . hydrochlorothiazide (HYDRODIURIL) 25 MG tablet TAKE ONE TABLET BY MOUTH EVERY DAY 90 tablet 2  . losartan (COZAAR) 50 MG tablet Take 1 tablet (50 mg total) by mouth daily. 90 tablet 3  . metoprolol tartrate (LOPRESSOR) 25 MG tablet Take 1 tablet (25 mg total) by mouth 2 (two) times daily. 180 tablet 3  . montelukast (SINGULAIR) 10 MG tablet Take 10 mg by mouth every morning.     . nitroGLYCERIN (NITROSTAT) 0.4 MG SL tablet Place 1 tablet (0.4 mg total) under the tongue every 5 (five) minutes as needed for chest pain. 25 tablet 6   No current facility-administered medications on file prior to visit.   Allergies  Allergen Reactions  . Bee Venom     Yellow jackets   SOCIAL AND FAMILY HISTORY REVIEWED IN EPIC -- No change  Wt Readings from Last 3 Encounters:  09/16/14 266 lb 11.2 oz (120.974 kg)  02/11/14 261 lb 9.6 oz (118.661 kg)  10/09/13 269 lb (122.018 kg)    PHYSICAL EXAM BP 128/76 mmHg  Pulse 83  Ht  (1.778 m)  Wt 266 lb 11.2 oz (120.974 kg)   BMI 38.27 kg/m2 General appearance: A&O x3, NAD, Cooperative, appears stated age; moderately obese. Well groomed. Pleasant mood and affect;  HEENT: [place]/AT, EOMI, MMM, anicteric sclera; sounds congested Neck: Thick, with no LAN, carotid bruit or JVD Lungs: CTA B., normal percussion bilaterally and non-labored Heart: RRR, S1, S2 normal, no murmur, click, rub or gallop; unable to palpate PMI due to body habitus Abdomen: soft, non-tender; bowel sounds normal; no masses, no organomegaly; probable obesity Extremities: extremities normal, atraumatic, no cyanosis, and trace edema Pulses: 2+ and symmetric Neurologic: Mental status: Alert, oriented, thought content appropriate Cranial nerves: normal (II-XII grossly intact)   Adult ECG Report  Rate: 83 ;  Rhythm: normal sinus rhythm and sinus arrhythmia  Narrative Interpretation: otherwise normal EKG  Recent Labs:  Last checked in 03/2014  Lab Results  Component Value Date   HGBA1C 5.4 04/10/2013   Lab Results  Component  Value Date   CREATININE 1.23 03/24/2014    ASSESSMENT / PLAN: History of NSTEMI (non-ST elevated myocardial infarction): Type II MI No signs of any significant infarct on Myoview. No untoward symptoms. Encouraged him to continue to stay active and so that we can assess for any change in exercise tolerance.   CAD, multiple vessel not amenable to PCI at cath 04/09/13 Do well post CABG. He definitely feels more energy and less dyspneic since his CABG. Continue aspirin statin beta blocker and ARB. Is now about a year and a half out from his CABG. Would consider post CABG Myoview in the next couple years.   Dyslipidemia, goal LDL below 70 Lab Results  Component Value Date   CHOL 120 10/07/2013   HDL 30* 10/07/2013   LDLCALC 67 03/24/2014   TRIG 105 03/24/2014   CHOLHDL 4.0 10/07/2013   Pretty much at goal from a total cholesterol and LDL standpoint. HDL remains low which would require more exercise. Continue Lipitor for  now at 80 mg. As long as he is tolerating it fine with not reducing doses. F/u Lipid Panel -- would need NMR panel for next check if consistently "at goal"   Essential hypertension Excellently blood pressure control on current medications. Maintain current regimen.   NSVT - NL LVF by echo No recurrent events. On beta blocker.   Sleep apnea- on C-pap Doing well with CPAP.     Obesity, Class II, BMI 35-39.9, with comorbidity The patient understands the need to lose weight with diet and exercise. We have discussed specific strategies for this. We again talked about the importance of getting exercise and decreasing by mouth intake.     Orders Placed This Encounter  Procedures  . Hepatic function panel    Standing Status: Future     Number of Occurrences:      Standing Expiration Date: 09/17/2015  . Lipid panel    Standing Status: Future     Number of Occurrences:      Standing Expiration Date: 09/17/2015  . EKG 12-Lead   Meds ordered this encounter  Medications  . benzonatate (TESSALON) 200 MG capsule    Sig: Take 200 mg by mouth 3 (three) times daily as needed for cough.     Followup: 6 months   HARDING, Piedad ClimesAVID W, M.D., M.S. Interventional Cardiologist   Pager # [phone]

## 2014-09-18 NOTE — Assessment & Plan Note (Signed)
Lab Results  Component Value Date   CHOL 120 10/07/2013   HDL 30* 10/07/2013   LDLCALC 67 03/24/2014   TRIG 105 03/24/2014   CHOLHDL 4.0 10/07/2013   Pretty much at goal from a total cholesterol and LDL standpoint. HDL remains low which would require more exercise. Continue Lipitor for now at 80 mg. As long as he is tolerating it fine with not reducing doses. F/u Lipid Panel -- would need NMR panel for next check if consistently "at goal"

## 2014-09-18 NOTE — Assessment & Plan Note (Signed)
Doing well with CPAP. 

## 2014-09-18 NOTE — Assessment & Plan Note (Signed)
No signs of any significant infarct on Myoview. No untoward symptoms. Encouraged him to continue to stay active and so that we can assess for any change in exercise tolerance.

## 2014-09-18 NOTE — Assessment & Plan Note (Signed)
Do well post CABG. He definitely feels more energy and less dyspneic since his CABG. Continue aspirin statin beta blocker and ARB. Is now about a year and a half out from his CABG. Would consider post CABG Myoview in the next couple years.

## 2014-09-18 NOTE — Assessment & Plan Note (Signed)
The patient understands the need to lose weight with diet and exercise. We have discussed specific strategies for this. We again talked about the importance of getting exercise and decreasing by mouth intake.

## 2014-09-18 NOTE — Assessment & Plan Note (Signed)
Excellently blood pressure control on current medications. Maintain current regimen.

## 2014-09-26 IMAGING — CR DG CHEST 1V PORT
1 series · 1 of 1 positions shown · non-contrast
Comparison: Chest x-ray 04/12/2013.

CLINICAL DATA: Status post cardiac surgery.

PORTABLE CHEST - 1 VIEW

[AP]
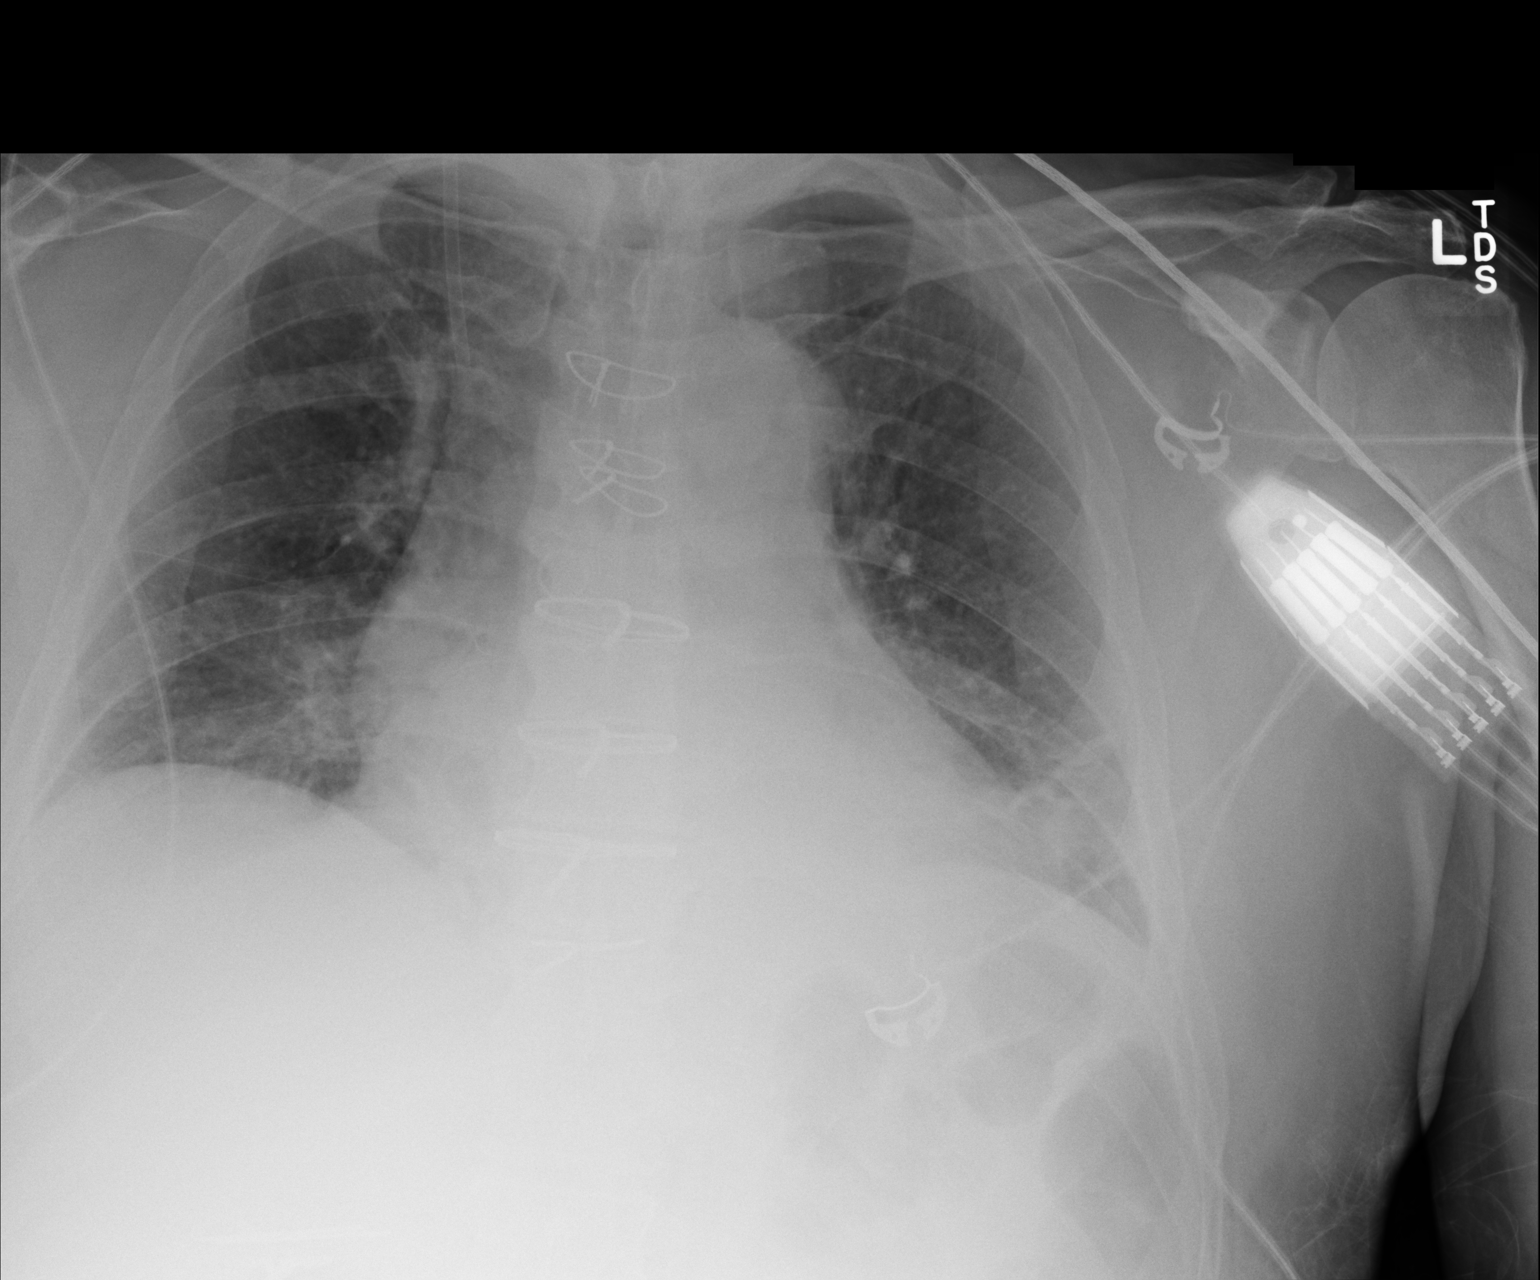

[1 of 1 positions shown; findings below may reference images not displayed]

FINDINGS: Previously noted left-sided chest tube has been removed.
No definite left-sided pneumothorax identified at this time.
Previously noted Swan-Ganz catheter has also been removed, although
the right IJ central venous Cordis remains in position with tip in
the proximal superior vena cava.  Lung volumes are low.  Minimal
bibasilar opacities are most compatible with areas of resolving
postoperative subsegmental atelectasis.  No appreciable pleural
effusions.  Mild pulmonary venous congestion, without frank
pulmonary edema.  Heart size appears borderline enlarged.
Mediastinal contours are within normal limits for postoperative
patient.  Status post median sternotomy for CABG.
IMPRESSION: 1.  Support apparatus and postoperative changes, as above.
2.  No definite pneumothorax following removal of a left-sided
chest tube.
3.  Resolving bibasilar subsegmental atelectasis.
4.  Mild pulmonary venous congestion without frank pulmonary edema.

## 2014-11-02 ENCOUNTER — Other Ambulatory Visit: Payer: Self-pay | Admitting: Cardiology

## 2014-11-02 NOTE — Telephone Encounter (Signed)
Rx has been sent to the pharmacy electronically. ° °

## 2014-11-11 ENCOUNTER — Other Ambulatory Visit: Payer: Self-pay | Admitting: Cardiology

## 2014-11-11 NOTE — Telephone Encounter (Signed)
Rx(s) sent to pharmacy electronically.  

## 2015-01-27 ENCOUNTER — Telehealth: Payer: Self-pay | Admitting: Cardiology

## 2015-02-05 NOTE — Telephone Encounter (Signed)
Close encounter 

## 2015-02-16 ENCOUNTER — Other Ambulatory Visit: Payer: Self-pay

## 2015-02-16 DIAGNOSIS — E785 Hyperlipidemia, unspecified: Secondary | ICD-10-CM

## 2015-02-16 DIAGNOSIS — Z79899 Other long term (current) drug therapy: Secondary | ICD-10-CM

## 2015-02-26 LAB — HEPATIC FUNCTION PANEL
ALT: 44 U/L (ref 0–53)
AST: 33 U/L (ref 0–37)
Albumin: 4.3 g/dL (ref 3.5–5.2)
Alkaline Phosphatase: 42 U/L (ref 39–117)
BILIRUBIN DIRECT: 0.2 mg/dL (ref 0.0–0.3)
BILIRUBIN INDIRECT: 0.7 mg/dL (ref 0.2–1.2)
TOTAL PROTEIN: 7.1 g/dL (ref 6.0–8.3)
Total Bilirubin: 0.9 mg/dL (ref 0.2–1.2)

## 2015-02-26 LAB — LIPID PANEL
Cholesterol: 117 mg/dL (ref 0–200)
HDL: 30 mg/dL — AB (ref >=40)
LDL Cholesterol: 66 mg/dL (ref 0–99)
Total CHOL/HDL Ratio: 3.9 Ratio
Triglycerides: 104 mg/dL (ref <150)
VLDL: 21 mg/dL (ref 0–40)

## 2015-03-01 ENCOUNTER — Telehealth: Payer: Self-pay | Admitting: *Deleted

## 2015-03-01 NOTE — Telephone Encounter (Signed)
-----   Message from Marykay Lex, MD sent at 03/01/2015  3:59 PM EDT ----- Relatively stable liver function tests. Slightly elevated from normal about a year ago but now normal. HDL is still a low, but LDL is at goal 1 total cholesterol is at goal. Continue to exercise and continue current dose of statin.  Marykay Lex, MD  Please forward to Ardelle Balls, MD

## 2015-03-01 NOTE — Telephone Encounter (Signed)
Left message to call back- in regards to lab 

## 2015-03-03 ENCOUNTER — Encounter: Payer: Self-pay | Admitting: *Deleted

## 2015-03-03 NOTE — Telephone Encounter (Signed)
Mailed letter with results. 

## 2015-04-02 ENCOUNTER — Ambulatory Visit (INDEPENDENT_AMBULATORY_CARE_PROVIDER_SITE_OTHER): Payer: Medicare Other | Admitting: Cardiology

## 2015-04-02 ENCOUNTER — Encounter: Payer: Self-pay | Admitting: Cardiology

## 2015-04-02 VITALS — BP 122/68 | HR 68 | Ht 68.0 in | Wt 258.0 lb

## 2015-04-02 DIAGNOSIS — E785 Hyperlipidemia, unspecified: Secondary | ICD-10-CM | POA: Diagnosis not present

## 2015-04-02 DIAGNOSIS — I2510 Atherosclerotic heart disease of native coronary artery without angina pectoris: Secondary | ICD-10-CM

## 2015-04-02 DIAGNOSIS — I10 Essential (primary) hypertension: Secondary | ICD-10-CM

## 2015-04-02 DIAGNOSIS — Z951 Presence of aortocoronary bypass graft: Secondary | ICD-10-CM

## 2015-04-02 DIAGNOSIS — IMO0001 Reserved for inherently not codable concepts without codable children: Secondary | ICD-10-CM

## 2015-04-02 NOTE — Patient Instructions (Signed)
No change with current medications     Your physician wants you to follow-up in 12 months with DR HARDING.You will receive a reminder letter in the mail two months in advance. If you don't receive a letter, please call our office to schedule the follow-up appointment.  

## 2015-04-02 NOTE — Progress Notes (Signed)
PCP: David Balls, MD  Clinic Note: Chief Complaint  Patient presents with  . 6 months    Patient has SOB.NO CHEST DISCOMFORT , NO SWELLING    HPI: David Hogan is a 69 y.o. male with a PMH below who presents today for 6 month follow-up for  CAD.  David Hogan was last seen in Feb 2016 --> doing well. No med changes.  Recent Hospitalizations: ER visit last week for Heat Exhaustion - overdid it with yard work in extreme heat & not adequately hydrating --> better after IVF.  Other studies Reviewed: Additional studies/ records that were reviewed today include:   Lab Results  Component Value Date   CHOL 117 02/26/2015   HDL 30* 02/26/2015   LDLCALC 66 02/26/2015   TRIG 104 02/26/2015   CHOLHDL 3.9 02/26/2015    Relatively stable liver function tests. Slightly elevated from normal about a year ago but now normal. HDL is still a low, but LDL is at goal 1 total cholesterol is at goal. Continue to exercise and continue current dose of statin -- however, by NMR panel in 2015 - LDL Particle # & calculated value did not correlate.  Interval History:  With the exception of his episode of dehydration mediated near syncope last week, he has been relatively stable from cardiac standpoint with no active symptoms.  He has tried to adjust his diet and is hoping to go pick up his exercise level and attempt to lose weight. Cardiovascular ROS: positive for - dyspnea on exertion and more related to obesity -- getting better (as long as it is not too hot) negative for - chest pain, edema, irregular heartbeat, loss of consciousness, orthopnea, palpitations, paroxysmal nocturnal dyspnea, rapid heart rate, shortness of breath or Near syncope, TIA/amaurosis fugax    Past Medical History  Diagnosis Date  . Asthma   . Complication of anesthesia     "woke up wild in recovery after gallbladder OR" (04/08/2013)  . Hypertension   . Heart murmur   . Exertional shortness of breath   . OSA on CPAP    . 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 History of: Non-STEMI (non-ST elevated myocardial infarction) 04/08/2013    Down in the setting of syncope in reaction to heat exhaustion plus multiple bee stings    ROS: A comprehensive was performed. Review of Systems  Constitutional: Positive for weight loss (intentional - trying to exercise & monitor diet).  HENT: Positive for nosebleeds.   Respiratory: Positive for wheezing (mostly upper airway stridor sounds ). Negative for cough and shortness of breath.   Cardiovascular: Positive for leg swelling (mild if on feet all day). Negative for claudication.  Gastrointestinal: Negative for blood in stool and melena.  Genitourinary: Negative for hematuria.  Neurological: Positive for dizziness (he had an episode of near syncopy from heat exhaustion doing yard work in extreme heat --> had a trip to the ER for IVF).  All other systems reviewed and are negative.    Past Surgical History  Procedure Laterality Date  . Shoulder open rotator cuff repair Bilateral 1990's-2000's  . Incision and drainage of wound  ~ 1996    "hit elbow at work; it swelled up; had it opened up to get pus out" (04/08/2013)  . Cholecystectomy  ~ 2010  . Hernia repair  ~ 2012    "from the gallbladder OR" (04/08/2013)  . Carpal tunnel  release Left 1990's  . Coronary artery bypass graft N/A 04/11/2013    Procedure: CORONARY ARTERY BYPASS GRAFTING (CABGx3: LIMA-LAD, SVG-RI, SVG-D1) ;  Surgeon: David Ovens, MD;  Location: [facility] OR;  Service: Open Heart Surgery;  Laterality: N/A;  x3 using right greater saphenous vein and left internal mammary.  . Intraoperative transesophageal echocardiogram N/A 04/11/2013    Procedure: INTRAOPERATIVE TRANSESOPHAGEAL ECHOCARDIOGRAM;  Surgeon: David Ovens, MD;  Location: [facility] OR;  Service: Open Heart Surgery;  Laterality: N/A;  . Transthoracic echocardiogram   04/09/2013    EF 65-70%. Normal wall motion. Grade 1 diastolic dysfunction. Aortic sclerosis but no stenosis.  . Cardiac catheterization  04/09/2013    Ostial and proximal LAD involving D1 80-90% stenoses, ramus intermedius proximal to ostial 90%. Otherwise mild diffuse disease in the circumflex and RCA just region.  . Left heart catheterization with coronary angiogram N/A 04/09/2013    Procedure: LEFT HEART CATHETERIZATION WITH CORONARY ANGIOGRAM;  Surgeon: David Lex, MD;  Location: [facility] CATH LAB;  Service: Cardiovascular;  Laterality: N/A;   Prior to Admission medications   Medication Sig Start Date End Date Taking? Authorizing Provider  aspirin EC 81 MG tablet Take 1 tablet (81 mg total) by mouth daily. 04/30/13  Yes David K Kilroy, PA-C  atorvastatin (LIPITOR) 80 MG tablet TAKE ONE TABLET BY MOUTH EVERY DAY AT 6PM 07/03/14  Yes David Lex, MD  benzonatate (TESSALON) 200 MG capsule Take 200 mg by mouth 3 (three) times daily as needed for cough.   Yes Historical Provider, MD  EPIPEN 2-PAK 0.3 MG/0.3ML SOAJ injection  04/29/13  Yes Historical Provider, MD  fenofibrate 160 MG tablet TAKE ONE TABLET BY MOUTH EVERY DAY 07/03/14  Yes David Lex, MD  hydrochlorothiazide (HYDRODIURIL) 25 MG tablet TAKE ONE TABLET BY MOUTH EVERY DAY 08/03/14  Yes David Lex, MD  losartan (COZAAR) 50 MG tablet TAKE ONE TABLET BY MOUTH EVERY DAY 11/02/14  Yes David Lex, MD  metoprolol tartrate (LOPRESSOR) 25 MG tablet Take 1 tablet (25 mg total) by mouth 2 (two) times daily. 11/11/14  Yes David Lex, MD  montelukast (SINGULAIR) 10 MG tablet Take 10 mg by mouth every morning.    Yes Historical Provider, MD  nitroGLYCERIN (NITROSTAT) 0.4 MG SL tablet Place 1 tablet (0.4 mg total) under the tongue every 5 (five) minutes as needed for chest pain. 11/11/14  Yes David Lex, MD   Allergies  Allergen Reactions  . Bee Venom     Yellow jackets    Social History   Social History  . Marital Status:  Married    Spouse Name: N/A  . Number of Children: N/A  . Years of Education: N/A   Social History Main Topics  . Smoking status: Never Smoker   . Smokeless tobacco: Never Used  . Alcohol Use: Yes     Comment: 04/09/2013 "last drink was in 1978 or so; never had a problem w/it"  . Drug Use: No  . Sexual Activity: Yes   Other Topics Concern  . None   Social History Narrative   Married for 32 years. No living children. The 2 grandchildren.    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 Does not drink alcohol.   He currently walks 2-3 times a day since discharge.   He now wears a baseball hat with several bees on it that says "who knew"   Family History  Problem Relation  Age of Onset  . Stroke Mother 109  . Heart disease Father     Wt Readings from Last 3 Encounters:  04/02/15 258 lb (117.028 kg)  09/16/14 266 lb 11.2 oz (120.974 kg)  02/11/14 261 lb 9.6 oz (118.661 kg)    PHYSICAL EXAM BP 122/68 mmHg  Pulse 68  Ht 5\' 8"  (1.727 m)  Wt 258 lb (117.028 kg)  BMI 39.24 kg/m2 General appearance: A&O x3, NAD, Cooperative, appears stated age; moderately obese. Well groomed. Pleasant mood and affect;  HEENT: [place]/AT, EOMI, MMM, anicteric sclera; sounds congested Neck: Thick, with no LAN, carotid bruit or JVD Lungs: CTA B., normal percussion bilaterally and non-labored Heart: RRR, S1, S2 normal, no murmur, click, rub or gallop; unable to palpate PMI due to body habitus Abdomen: soft, non-tender; bowel sounds normal; no masses, no organomegaly; probable obesity Extremities: extremities normal, atraumatic, no cyanosis, and trace edema Pulses: 2+ and symmetric Neurologic: Mental status: Alert, oriented, thought content appropriate Cranial nerves: normal (II-XII grossly intact)   Adult ECG Report  Rate: 68 ;  Rhythm: normal sinus rhythm and premature atrial contractions (PAC);   Narrative Interpretation: otherwise normal EKG but normal axis,  intervals and durations.   ASSESSMENT / PLAN: Problem List Items Addressed This Visit    CAD, multiple vessel not amenable to PCI at cath 04/09/13 - Primary (Chronic)    Continued to do well status post CABG. Definitely has better energy levels. Hoping to continue diet and exercise for weight loss. On aspirin, statin + fibrate , ARB and beta blocker. He is now 2 years post CABG. In the absence of symptoms, would not consider Myoview for another 2 years.      Relevant Orders   EKG 12-Lead   Dyslipidemia, goal LDL below 70 (Chronic)    From my screening lab standpoint, his labs seem to be doing pretty well. He is on high-dose statin + fenofibrate. Next lipid panel should be and NMR type panel to reassess LDL Particle #.   May need more aggressive Rx - but hopefully with diet & exercise, he can change his profile.      Relevant Orders   EKG 12-Lead   Essential hypertension (Chronic)    Excellent control on current medications. No change.      Relevant Orders   EKG 12-Lead   Obesity, Class II, BMI 35-39.9, with comorbidity (Chronic)    Thankfully, he has for the most direct way to you gained back in February. The patient understands the need to lose weight with diet and exercise. We have discussed specific strategies for this. May need nutrition counseling.      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 (+/- concerns) n/a The following changes have been made: None Studies Ordered:   Orders Placed This Encounter  Procedures  . EKG 12-Lead    1 yr   David Hogan, M.D., M.S. Interventional Cardiologist   Pager # [phone]

## 2015-04-04 NOTE — Assessment & Plan Note (Signed)
Thankfully, he has for the most direct way to you gained back in February. The patient understands the need to lose weight with diet and exercise. We have discussed specific strategies for this. May need nutrition counseling.

## 2015-04-04 NOTE — Assessment & Plan Note (Signed)
Continued to do well status post CABG. Definitely has better energy levels. Hoping to continue diet and exercise for weight loss. On aspirin, statin + fibrate , ARB and beta blocker. He is now 2 years post CABG. In the absence of symptoms, would not consider Myoview for another 2 years.

## 2015-04-04 NOTE — Assessment & Plan Note (Signed)
Excellent control on current medications. No change 

## 2015-04-04 NOTE — Assessment & Plan Note (Signed)
From my screening lab standpoint, his labs seem to be doing pretty well. He is on high-dose statin + fenofibrate. Next lipid panel should be and NMR type panel to reassess LDL Particle #.   May need more aggressive Rx - but hopefully with diet & exercise, he can change his profile.

## 2015-04-13 ENCOUNTER — Telehealth: Payer: Self-pay | Admitting: Cardiology

## 2015-04-13 NOTE — Telephone Encounter (Signed)
Spoke with Huntley Dec from allergy doctor's office - she wanted to clarify patient's BP medications as they had him taking 2 ACE-I   Informed her he does not take ACE-I but takes an ARB

## 2015-04-29 ENCOUNTER — Other Ambulatory Visit: Payer: Self-pay | Admitting: Cardiology

## 2015-04-29 NOTE — Telephone Encounter (Signed)
REFILL 

## 2015-08-12 ENCOUNTER — Other Ambulatory Visit: Payer: Self-pay | Admitting: Cardiology

## 2015-08-12 NOTE — Telephone Encounter (Signed)
REFILL 

## 2015-11-09 ENCOUNTER — Other Ambulatory Visit: Payer: Self-pay | Admitting: Cardiology

## 2015-11-10 NOTE — Telephone Encounter (Signed)
Rx(s) sent to pharmacy electronically.  

## 2016-03-23 ENCOUNTER — Encounter (INDEPENDENT_AMBULATORY_CARE_PROVIDER_SITE_OTHER): Payer: Self-pay

## 2016-03-23 ENCOUNTER — Ambulatory Visit (INDEPENDENT_AMBULATORY_CARE_PROVIDER_SITE_OTHER): Payer: Medicare Other | Admitting: Cardiology

## 2016-03-23 ENCOUNTER — Encounter: Payer: Self-pay | Admitting: Cardiology

## 2016-03-23 VITALS — BP 130/60 | HR 73 | Ht 67.0 in | Wt 274.2 lb

## 2016-03-23 DIAGNOSIS — Z951 Presence of aortocoronary bypass graft: Secondary | ICD-10-CM

## 2016-03-23 DIAGNOSIS — IMO0001 Reserved for inherently not codable concepts without codable children: Secondary | ICD-10-CM

## 2016-03-23 DIAGNOSIS — I10 Essential (primary) hypertension: Secondary | ICD-10-CM

## 2016-03-23 DIAGNOSIS — G4730 Sleep apnea, unspecified: Secondary | ICD-10-CM

## 2016-03-23 DIAGNOSIS — I2510 Atherosclerotic heart disease of native coronary artery without angina pectoris: Secondary | ICD-10-CM

## 2016-03-23 DIAGNOSIS — I214 Non-ST elevation (NSTEMI) myocardial infarction: Secondary | ICD-10-CM | POA: Diagnosis not present

## 2016-03-23 DIAGNOSIS — E785 Hyperlipidemia, unspecified: Secondary | ICD-10-CM

## 2016-03-23 NOTE — Progress Notes (Signed)
PCP: Ardelle Balls, MD  Clinic Note: Chief Complaint  Patient presents with  . Follow-up    Annual follow-up for CAD    HPI: David Hogan is a 70 y.o. male with a PMH below who presents today for 12 month follow-up for  CAD-CABG.  David Hogan was last seen in August 2016 --> doing well. No med changes.  Recent Hospitalizations:n/a  Other studies Reviewed: Additional studies/ records that were reviewed today include:  - No new CV Studies -- labs checked by PCP  Lab Results  Component Value Date   CHOL 117 02/26/2015   HDL 30 (L) 02/26/2015   LDLCALC 66 02/26/2015   TRIG 104 02/26/2015   CHOLHDL 3.9 02/26/2015    Interval History:  Quite presents today doing well without any major complaints. He just notes that he may get a little bit short of breath when he walks around a lot. He a knowledge is (although his wife says it is worse) that he really isn't exercising like he should. He also got out of the habits of his healthy diet.  Cardiovascular ROS: positive for - dyspnea on exertion and more related to obesity -- getting better (as long as it is not too hot) negative for - chest pain, edema, irregular heartbeat, loss of consciousness, orthopnea, palpitations, paroxysmal nocturnal dyspnea, rapid heart rate, shortness of breath or Near syncope, TIA/amaurosis fugax   Past Medical History:  Diagnosis Date  . Asthma   . 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 Complication of anesthesia    "woke up wild in recovery after gallbladder OR" (04/08/2013)  . Exertional shortness of breath   . Heart murmur   . History of: Non-STEMI (non-ST elevated myocardial infarction) 04/08/2013   Down in the setting of syncope in reaction to heat exhaustion plus multiple bee stings  . Hypertension   . OSA on CPAP   . 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 Review of Systems  Constitutional: Positive  for weight loss (now gained back ~20 lb).  HENT: Positive for nosebleeds.   Respiratory: Positive for wheezing (mostly upper airway stridor sounds ). Negative for cough and shortness of breath.   Cardiovascular: Positive for leg swelling (mild if on feet all day). Negative for claudication.  Gastrointestinal: Negative for blood in stool and melena.  Genitourinary: Negative for hematuria.  Neurological: Negative for dizziness.  All other systems reviewed and are negative.   Past Surgical History:  Procedure Laterality Date  . CARDIAC CATHETERIZATION  04/09/2013   Ostial and proximal LAD involving D1 80-90% stenoses, ramus intermedius proximal to ostial 90%. Otherwise mild diffuse disease in the circumflex and RCA just region.  . CARPAL TUNNEL RELEASE Left 1990's  . CHOLECYSTECTOMY  ~ 2010  . CORONARY ARTERY BYPASS GRAFT N/A 04/11/2013   Procedure: CORONARY ARTERY BYPASS GRAFTING (CABGx3: LIMA-LAD, SVG-RI, SVG-D1) ;  Surgeon: Delight Ovens, MD;  Location: [facility] OR;  Service: Open Heart Surgery;  Laterality: N/A;  x3 using right greater saphenous vein and left internal mammary.  [name] HERNIA REPAIR  ~ 2012   "from the gallbladder OR" (04/08/2013)  . INCISION AND DRAINAGE OF WOUND  ~ 1996   "hit elbow at work; it swelled up; had it opened up to get pus out" (04/08/2013)  . INTRAOPERATIVE TRANSESOPHAGEAL ECHOCARDIOGRAM N/A 04/11/2013   Procedure: INTRAOPERATIVE TRANSESOPHAGEAL ECHOCARDIOGRAM;  Surgeon: Delight Ovens, MD;  Location: [facility] OR;  Service: Open Heart Surgery;  Laterality: N/A;  . LEFT HEART CATHETERIZATION WITH CORONARY ANGIOGRAM N/A 04/09/2013   Procedure: LEFT HEART CATHETERIZATION WITH CORONARY ANGIOGRAM;  Surgeon: Marykay Lexavid W Isaack Preble, MD;  [facility];  Service: Cardiovascular;  Laterality: N/A;  . SHOULDER OPEN ROTATOR CUFF REPAIR Bilateral 1990's-2000's  . TRANSTHORACIC ECHOCARDIOGRAM  04/09/2013   EF 65-70%. Normal wall motion. Grade 1 diastolic dysfunction. Aortic sclerosis but  no stenosis.     Prior to Admission medications   Medication Sig Start Date End Date Taking? Authorizing Provider  aspirin EC 81 MG tablet Take 1 tablet (81 mg total) by mouth daily. 04/30/13  Yes Luke K Kilroy, PA-C  atorvastatin (LIPITOR) 80 MG tablet TAKE ONE TABLET BY MOUTH EVERY DAY AT 6PM 08/12/15  Yes Marykay Lexavid W Dvante Hands, MD  benzonatate (TESSALON) 200 MG capsule Take 200 mg by mouth 3 (three) times daily as needed for cough.   Yes Historical Provider, MD  EPIPEN 2-PAK 0.3 MG/0.3ML SOAJ injection  04/29/13  Yes Historical Provider, MD  fenofibrate 160 MG tablet TAKE ONE TABLET BY MOUTH EVERY DAY 08/12/15  Yes Marykay Lexavid W Valisa Karpel, MD  hydrochlorothiazide (HYDRODIURIL) 25 MG tablet TAKE ONE TABLET BY MOUTH EVERY DAY 04/29/15  Yes Marykay Lexavid W Jaqlyn Gruenhagen, MD  losartan (COZAAR) 100 MG tablet Take 100 mg by mouth daily.   Yes Historical Provider, MD  metoprolol tartrate (LOPRESSOR) 25 MG tablet TAKE ONE TABLET BY MOUTH 2 TIMES A DAY 11/10/15  Yes Marykay Lexavid W Parish Augustine, MD  montelukast (SINGULAIR) 10 MG tablet Take 10 mg by mouth every morning.    Yes Historical Provider, MD  nitroGLYCERIN (NITROSTAT) 0.4 MG SL tablet Place 1 tablet (0.4 mg total) under the tongue every 5 (five) minutes as needed for chest pain. 11/11/14  Yes Marykay Lexavid W Alinna Siple, MD  Spacer/Aero-Holding Chambers (AEROCHAMBER PLUS FLO-VU) MISC Take 2 puffs by mouth as needed (SOB).  05/29/12  Yes Historical Provider, MD      Allergies  Allergen Reactions  . Bee Venom Other (See Comments)    Yellow jackets    Social History   Social History  . Marital status: Married    Spouse name: N/A  . Number of children: N/A  . Years of education: N/A   Social History Main Topics  . Smoking status: Never Smoker  . Smokeless tobacco: Never Used  . Alcohol use Yes     Comment: 04/09/2013 "last drink was in 1978 or so; never had a problem w/it"  . Drug use: No  . Sexual activity: Yes   Other Topics Concern  . None   Social History Narrative   Married for 32  years. No living children. The 2 grandchildren.    He is a retired Biochemist, clinicaldetention officer [employer]   He never smoked. Does not drink alcohol.   He currently walks 2-3 times a day since discharge.   He now wears a baseball hat with several bees on it that says "who knew"  -- not doing exercise in a "long time"   Family History  Problem Relation Age of Onset  . Heart disease Father   . Stroke Mother 2366  . Heart attack Mother   . Heart attack Brother     Wt Readings from Last 3 Encounters:  03/23/16 274 lb 3.2 oz (124.4 kg)  04/02/15 258 lb (117 kg)  09/16/14 266 lb 11.2 oz (121 kg)    PHYSICAL EXAM BP 130/60   Pulse 73   Ht 5\' 7"  (  1.702 m)   Wt 274 lb 3.2 oz (124.4 kg)   BMI 42.95 kg/m  General appearance: A&O x3, NAD, Cooperative, appears stated age; moderately obese. Well groomed. Pleasant mood and affect;  HEENT: [place]/AT, EOMI, MMM, anicteric sclera; sounds congested Neck: Thick, with no LAN, carotid bruit or JVD Lungs: CTA B., normal percussion bilaterally and non-labored Heart: RRR, S1, S2 normal, no murmur, click, rub or gallop; unable to palpate PMI due to body habitus Abdomen: soft, non-tender; bowel sounds normal; no masses, no organomegaly; probable obesity Extremities: extremities normal, atraumatic, no cyanosis, and trace edema Pulses: 2+ and symmetric Neurologic: Mental status: Alert, oriented, thought content appropriate Cranial nerves: normal (II-XII grossly intact)   Adult ECG Report  Rate: 73;  Rhythm: normal sinus rhythm and premature atrial contractions (PAC) versus sinus arrhythmia  Narrative Interpretation: otherwise normal EKG but normal axis, intervals and durations.   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 I continue to stress the importance of ensuring that he continue exercising and watch his diet. Strongly consider referral to dietitian.      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 He has not had any further episodes like this. He had a mild troponin elevation, so no signs of any heart failure.      Relevant Medications   losartan (COZAAR) 100 MG tablet   nitroGLYCERIN (NITROSTAT) 0.4 MG SL tablet   Other Relevant Orders   EKG 12-Lead   Essential hypertension (Chronic)    Excellent control on current medications.      Relevant Medications   losartan (COZAAR) 100 MG tablet   nitroGLYCERIN (NITROSTAT) 0.4 MG SL tablet   Other Relevant Orders   EKG 12-Lead   Dyslipidemia, goal LDL below 70 (Chronic)    Continues to be on high-dose statin and fenofibrate. Labs are now being followed by his PCP, not readily available. Had a history of low HDL which is a concern, all the more reason to stress the importance of exercise along with activity modification      Relevant Medications   losartan (COZAAR) 100 MG tablet   nitroGLYCERIN (NITROSTAT) 0.4 MG SL tablet   Other Relevant Orders   EKG 12-Lead   CAD, multiple vessel not amenable to PCI at cath 04/09/13 - Primary (Chronic)    Continues to do well overall from a cardiac standpoint post CABG. His needs to get back into exercising and working on weight loss. Steady on aspirin, statin, 5 rate, ARB and beta blocker.  Plan would be consider Myoview after follow-up visit in one year.      Relevant Medications   losartan (COZAAR) 100 MG tablet   nitroGLYCERIN (NITROSTAT) 0.4 MG SL tablet   Other Relevant Orders   EKG 12-Lead    Other Visit Diagnoses   None.      Current medicines are reviewed at length with the patient today. (+/- concerns) n/a The following changes  have been made: None Studies Ordered:   Orders Placed This Encounter  Procedures  . EKG 12-Lead    1 yr   Bryan Lemma, M.D., M.S. Interventional Cardiologist   Pager # [phone]

## 2016-03-23 NOTE — Patient Instructions (Signed)
Medication Instructions:  Your physician recommends that you continue on your current medications as directed. Please refer to the Current Medication list given to you today.   Labwork: Labwork will be requested from your primary care physician.   Follow-Up: Your physician wants you to follow-up in: 6 MONTHS WITH DR HARDING. You will receive a reminder letter in the mail two months in advance. If you don't receive a letter, please call our office to schedule the follow-up appointment.  If you need a refill on your cardiac medications before your next appointment, please call your pharmacy.

## 2016-03-25 ENCOUNTER — Encounter: Payer: Self-pay | Admitting: Cardiology

## 2016-03-25 NOTE — Assessment & Plan Note (Signed)
This is a very interesting course of events that led to his MI. He has not had any further episodes like this. He had a mild troponin elevation, so no signs of any heart failure.

## 2016-03-25 NOTE — Assessment & Plan Note (Signed)
Excellent control on current medications. 

## 2016-03-25 NOTE — Assessment & Plan Note (Signed)
Continues to be on high-dose statin and fenofibrate. Labs are now being followed by his PCP, not readily available. Had a history of low HDL which is a concern, all the more reason to stress the importance of exercise along with activity modification

## 2016-03-25 NOTE — Assessment & Plan Note (Signed)
Currently, he has not been doing very well with his weight, gained about 20 pounds since I last seen him. I continue to stress the importance of ensuring that he continue exercising and watch his diet. Strongly consider referral to dietitian.

## 2016-03-25 NOTE — Assessment & Plan Note (Signed)
Continues to do well overall from a cardiac standpoint post CABG. His needs to get back into exercising and working on weight loss. Steady on aspirin, statin, 5 rate, ARB and beta blocker.  Plan would be consider Myoview after follow-up visit in one year.

## 2016-07-07 ENCOUNTER — Other Ambulatory Visit: Payer: Self-pay | Admitting: Cardiology

## 2016-08-04 ENCOUNTER — Other Ambulatory Visit: Payer: Self-pay | Admitting: Cardiology

## 2016-08-04 NOTE — Telephone Encounter (Signed)
Rx has been sent to the pharmacy electronically. ° °

## 2016-08-16 ENCOUNTER — Other Ambulatory Visit: Payer: Self-pay | Admitting: Cardiology

## 2016-08-16 NOTE — Telephone Encounter (Signed)
Rx(s) sent to pharmacy electronically.  

## 2016-09-02 ENCOUNTER — Other Ambulatory Visit: Payer: Self-pay | Admitting: Cardiology

## 2016-09-07 HISTORY — PX: TRANSTHORACIC ECHOCARDIOGRAM: SHX275

## 2016-09-07 HISTORY — PX: NM MYOVIEW LTD: HXRAD82

## 2016-09-20 ENCOUNTER — Ambulatory Visit (INDEPENDENT_AMBULATORY_CARE_PROVIDER_SITE_OTHER): Payer: Medicare Other | Admitting: Cardiology

## 2016-09-20 ENCOUNTER — Encounter: Payer: Self-pay | Admitting: Cardiology

## 2016-09-20 VITALS — BP 122/70 | HR 76 | Ht 67.0 in | Wt 280.0 lb

## 2016-09-20 DIAGNOSIS — I359 Nonrheumatic aortic valve disorder, unspecified: Secondary | ICD-10-CM

## 2016-09-20 DIAGNOSIS — I350 Nonrheumatic aortic (valve) stenosis: Secondary | ICD-10-CM | POA: Insufficient documentation

## 2016-09-20 DIAGNOSIS — I214 Non-ST elevation (NSTEMI) myocardial infarction: Secondary | ICD-10-CM

## 2016-09-20 DIAGNOSIS — R0609 Other forms of dyspnea: Secondary | ICD-10-CM

## 2016-09-20 DIAGNOSIS — I2510 Atherosclerotic heart disease of native coronary artery without angina pectoris: Secondary | ICD-10-CM | POA: Diagnosis not present

## 2016-09-20 DIAGNOSIS — R0600 Dyspnea, unspecified: Secondary | ICD-10-CM | POA: Insufficient documentation

## 2016-09-20 DIAGNOSIS — I10 Essential (primary) hypertension: Secondary | ICD-10-CM

## 2016-09-20 DIAGNOSIS — IMO0001 Reserved for inherently not codable concepts without codable children: Secondary | ICD-10-CM

## 2016-09-20 DIAGNOSIS — E669 Obesity, unspecified: Secondary | ICD-10-CM | POA: Diagnosis not present

## 2016-09-20 DIAGNOSIS — Z951 Presence of aortocoronary bypass graft: Secondary | ICD-10-CM

## 2016-09-20 DIAGNOSIS — G4733 Obstructive sleep apnea (adult) (pediatric): Secondary | ICD-10-CM

## 2016-09-20 DIAGNOSIS — E785 Hyperlipidemia, unspecified: Secondary | ICD-10-CM

## 2016-09-20 NOTE — Assessment & Plan Note (Signed)
No active anginal symptoms, however he really did not have any angina prior to his cardiac events. In light of his progressively worsening dyspnea, I will check a Myoview stress test. Otherwise he is on stable regimen of aspirin, statin, ARB and beta blocker.

## 2016-09-20 NOTE — Assessment & Plan Note (Addendum)
Managed by PCP. Last check within a month reportedly at goal. Hopefully with weight loss having a dietitian consult will help.

## 2016-09-20 NOTE — Assessment & Plan Note (Signed)
Have been doing relatively well, but is now noticing progressive dyspnea on exertion. This is one of his pre-episode symptoms leading up to his minor MI angina finding multivessel CAD. I'm concerned of potential graft disease. Especially because of his continued weight gain.  Plan: Treadmill Myoview (okayed converted to PalmerLexiscan). I've asked that he hold his metoprolol the evening before and the morning of his stress test and then take it once this test is completed.

## 2016-09-20 NOTE — Assessment & Plan Note (Signed)
Probably related to deconditioning and weight gain, however would want to exclude any worsening cardiac disease. He would've been due for a screening Myoview later on this year, now that he is having symptoms, we will preemptively order a treadmill Myoview as well as an echocardiogram for cardiac evaluation for his dyspnea. If these are abnormal I will see him back sooner than 6 months, however would otherwise see him back as originally planned in 6 months.

## 2016-09-20 NOTE — Assessment & Plan Note (Signed)
His murmur sounds and moderate me. As he is now noticing worsening exertional dyspnea, I will exclude any worsening valvular disease with an echocardiogram.

## 2016-09-20 NOTE — Patient Instructions (Addendum)
SCHEDULE  [address] Your physician has requested that you have an echocardiogram. Echocardiography is a painless test that uses sound waves to create images of your heart. It provides your doctor with information about the size and shape of your heart and how well your heart's chambers and valves are working. This procedure takes approximately one hour. There are no restrictions for this procedure.    SCHEDULE  AT [address] Your physician has requested that you have en exercise stress myoview. For further information please visit [email or web address]. Please follow instruction sheet, as given. DO NOT TAKE METOPROLOL THE NIGHT  BEFORE OR THE MORNING OF THE TEST.   IF EITHER TEST ARE ABNORMAL ,WILL MAKE AN APPOINTMENT TO DISCUSS OTHERWISE   Your physician wants you to follow-up in 6 MONTH WITH DR HARDING. You will receive a reminder letter in the mail two months in advance. If you don't receive a letter, please call our office to schedule the follow-up appointment.   If you need a refill on your cardiac medications before your next appointment, please call your pharmacy.

## 2016-09-20 NOTE — Assessment & Plan Note (Signed)
Blood pressure stable on current meds.  No change 

## 2016-09-20 NOTE — Assessment & Plan Note (Signed)
He is actually gaining weight as opposed to losing weight. His PCP has referred him to a dietitian. We talked about the importance of adjusting his diet as well as increasing his exercise. His wife is here with her today and they both acknowledge that they need to get back into an excess program in order to lose the weight back. I think this is probably the major driving factor for his dyspnea.

## 2016-09-20 NOTE — Progress Notes (Signed)
PCP: Ardelle Balls, MD  Clinic Note: Chief Complaint  Patient presents with  . Follow-up    6 months  . Coronary Artery Disease    CABG    HPI: David Hogan is a 71 y.o. male with a PMH below who presents today for 6 month follow-up for  CAD-CABG. (Initial plan had been 12 month)   [name] was last seen in August 2017 --> doing well. No med changes. Baseline dyspnea on exertion. He did gained back 20 pounds from his postop weight  Recent Hospitalizations:n/a  Other studies Reviewed: Additional studies/ records that were reviewed today include:  - No new CV Studies -- labs checked by PCP  Interval History: [name] presents today doing well without any major complaints. He just notes that he may get a little bit short of breath when he walks around a lot -- Especially when he walks up a hill.[name] He will aknowledge (although his wife says it is worse) that he really isn't exercising like he should. He also got out of the habits of his healthy diet. When we really talked in more detail, what is seen that the dyspnea on exertion has gotten worse, and he may have a little bit of chest discomfort if he really pushes it. It was the dyspnea that really came prior to his cardiac event in the past. Likely has any PND, orthopnea or edema type symptoms or any arrhythmias.  Cardiovascular ROS: positive for - dyspnea on exertion and more related to obesity -- getting better (as long as it is not too hot) negative for - chest pain, edema, irregular heartbeat, loss of consciousness, orthopnea, palpitations, paroxysmal nocturnal dyspnea, rapid heart rate, shortness of breath or Near syncope, TIA/amaurosis fugax   Past Medical History:  Diagnosis Date  . Asthma   . 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 Complication of anesthesia    "woke up wild in recovery after gallbladder OR" (04/08/2013)  . Exertional shortness of breath   . Heart murmur   . History of: Non-STEMI  (non-ST elevated myocardial infarction) 04/08/2013   Down in the setting of syncope in reaction to heat exhaustion plus multiple bee stings  . Hypertension   . OSA on CPAP   . 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 CARDIAC CATHETERIZATION  04/09/2013   Ostial and proximal LAD involving D1 80-90% stenoses, ramus intermedius proximal to ostial 90%. Otherwise mild diffuse disease in the circumflex and RCA just region.  . CARPAL TUNNEL RELEASE Left 1990's  . CHOLECYSTECTOMY  ~ 2010  . CORONARY ARTERY BYPASS GRAFT N/A 04/11/2013   Procedure: CORONARY ARTERY BYPASS GRAFTING (CABGx3: LIMA-LAD, SVG-RI, SVG-D1) ;  Surgeon: Delight Ovens, MD;  Location: [facility] [place];  Service: Open Heart Surgery;  Laterality: N/A;  x3 using right greater saphenous vein and left internal mammary.  [name] HERNIA REPAIR  ~ 2012   "from the gallbladder OR" (04/08/2013)  . INCISION AND DRAINAGE OF WOUND  ~ 1996   "hit elbow at work; it swelled up; had it opened up to get pus out" (04/08/2013)  . INTRAOPERATIVE TRANSESOPHAGEAL ECHOCARDIOGRAM N/A 04/11/2013   Procedure: INTRAOPERATIVE TRANSESOPHAGEAL ECHOCARDIOGRAM;  Surgeon: Delight Ovens, MD;  Location: [facility] OR;  Service: Open Heart Surgery;  Laterality: N/A;  . LEFT HEART CATHETERIZATION WITH CORONARY ANGIOGRAM N/A 04/09/2013   Procedure: LEFT HEART CATHETERIZATION WITH CORONARY ANGIOGRAM;  Surgeon: Piedad Climes  Herbie BaltimoreHarding, MD;  [facility];  Service: Cardiovascular;  Laterality: N/A;  . SHOULDER OPEN ROTATOR CUFF REPAIR Bilateral 1990's-2000's  . TRANSTHORACIC ECHOCARDIOGRAM  04/09/2013   EF 65-70%. Normal wall motion. Grade 1 diastolic dysfunction. Aortic sclerosis but no stenosis.    ROS: A comprehensive was performed. Review of Systems  Constitutional: Positive for weight loss (now gained back ~20 lb).  HENT: Positive for nosebleeds.   Eyes: Negative.   Respiratory: Positive for wheezing  (mostly upper airway stridor sounds ). Negative for cough and shortness of breath.        Uses CPAP  Cardiovascular: Positive for leg swelling (mild if on feet all day). Negative for claudication.  Gastrointestinal: Negative for blood in stool, heartburn and melena.  Genitourinary: Negative for hematuria.  Musculoskeletal: Positive for joint pain (Knees).  Skin: Negative.   Neurological: Negative for dizziness.  Endo/Heme/Allergies: Does not bruise/bleed easily.  Psychiatric/Behavioral: Negative.   All other systems reviewed and are negative.   Current Meds  Medication Sig  . aspirin EC 81 MG tablet Take 1 tablet (81 mg total) by mouth daily.  [name]. atorvastatin (LIPITOR) 80 MG tablet TAKE ONE TABLET BY MOUTH EVERY DAY AT 6PM  . benzonatate (TESSALON) 200 MG capsule Take 200 mg by mouth daily.   [name]. EPIPEN 2-PAK 0.3 MG/0.3ML SOAJ injection   . fenofibrate 160 MG tablet TAKE ONE TABLET BY MOUTH EVERY DAY  . hydrochlorothiazide (HYDRODIURIL) 25 MG tablet TAKE ONE TABLET BY MOUTH EVERY DAY  . losartan (COZAAR) 100 MG tablet Take 100 mg by mouth daily.  . metoprolol tartrate (LOPRESSOR) 25 MG tablet TAKE ONE TABLET BY MOUTH 2 TIMES A DAY  . montelukast (SINGULAIR) 10 MG tablet Take 10 mg by mouth every morning.   [name]. NITROSTAT 0.4 MG SL tablet PLACE 1 TABLET UNDER THE TONGUE EVERY 5 MINUTES AS NEEDED FOR CHEST PAIN  . Spacer/Aero-Holding Chambers (AEROCHAMBER PLUS FLO-VU) MISC Take 2 puffs by mouth as needed (SOB).     Allergies  Allergen Reactions  . Bee Venom Other (See Comments)    Yellow jackets    Social History   Social History  . Marital status: Married    Spouse name: N/A  . Number of children: N/A  . Years of education: N/A   Social History Main Topics  . Smoking status: Never Smoker  . Smokeless tobacco: Never Used  . Alcohol use Yes     Comment: 04/09/2013 "last drink was in 1978 or so; never had a problem w/it"  . Drug use: No  . Sexual activity: Yes   Other Topics  Concern  . None   Social History Narrative   Married for 32 years. No living children. The 2 grandchildren.    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 Does not drink alcohol.   He currently walks 2-3 times a day since discharge.   He now wears a baseball hat with several bees on it that says "who knew"  -- not doing exercise in a "long time"  Family History  Problem Relation Age of Onset  . Heart disease Father   . Stroke Mother [id number]  . Heart attack Mother   . Heart attack Brother     Wt Readings from Last 3 Encounters:  09/20/16 127 kg (280 lb)  03/23/16 124.4 kg (274 lb 3.2 oz)  04/02/15 117 kg (258 lb)    PHYSICAL EXAM BP 122/70   Pulse 76  Ht 5\' 7"  (1.702 m)   Wt 127 kg (280 lb)   BMI 43.85 kg/m  General appearance: A&O x3, NAD, Cooperative, appears stated age; moderately obese. Well groomed. Pleasant mood and affect;  HEENT: [place]/AT, EOMI, MMM, anicteric sclera; sounds congested Neck: Thick, with no LAN, carotid bruit or JVD Lungs: CTA B., normal percussion bilaterally and non-labored Heart: RRR, S1, S2 normal, 2-3/6 SEM & RUSB but herard t/o. No click, rub or gallop; unable to palpate PMI due to body habitus Abdomen: soft, non-tender; bowel sounds normal; no masses, no organomegaly; probable obesity Extremities: extremities normal, atraumatic, no cyanosis, and trace edema Pulses: 2+ and symmetric Neurologic: Mental status: Alert, oriented, thought content appropriate Cranial nerves: normal (II-XII grossly intact)   Adult ECG Report  Rate: 76;  Rhythm: normal sinus rhythm and Mild nonspecific ST and T-wave changes versus sinus arrhythmia  Narrative Interpretation: otherwise normal EKG but normal axis, intervals and durations.   ASSESSMENT / PLAN: Problem List Items Addressed This Visit    Aortic valve disease - Aortic Sclerosis on Echo with increasing murmur. (Chronic)    His murmur sounds and moderate me. As he is now  noticing worsening exertional dyspnea, I will exclude any worsening valvular disease with an echocardiogram.      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 In light of his progressively worsening dyspnea, I will check a Myoview stress test. Otherwise he is on stable regimen of aspirin, statin, ARB and beta blocker.      Relevant Orders   EKG 12-Lead (Completed)   ECHOCARDIOGRAM COMPLETE   Myocardial Perfusion Imaging   Dyslipidemia, goal LDL below 70 (Chronic)    Managed by PCP. Last check within a month reportedly at goal. Hopefully with weight loss having a dietitian consult will help.      Relevant Orders   EKG 12-Lead (Completed)   Myocardial Perfusion Imaging   Essential hypertension (Chronic)    Blood pressure stable on current meds. No change.      Relevant Orders   EKG 12-Lead (Completed)   Myocardial Perfusion Imaging   Exertional dyspnea - Primary    Probably related to deconditioning and weight gain, however would want to exclude any worsening cardiac disease. He would've been due for a screening Myoview later on this year, now that he is having symptoms, we will preemptively order a treadmill Myoview as well as an echocardiogram for cardiac evaluation for his dyspnea. If these are abnormal I will see him back sooner than 6 months, however would otherwise see him back as originally planned in 6 months.      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 His PCP has referred him to a dietitian. We talked about the importance of adjusting his diet as well as increasing his exercise. His wife is here with her today and they both acknowledge that they need to get back into an excess program  in order to lose the weight back. I think this is probably the major driving factor for his dyspnea.      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 This is one of his pre-episode symptoms leading up to his minor MI angina finding multivessel CAD. I'm concerned of potential graft disease. Especially because of his continued weight gain.  Plan: Treadmill Myoview (okayed converted to [place]). I've asked that he hold his metoprolol the evening before and the morning of his stress test and then take it once this test is completed.      Relevant Orders   EKG 12-Lead (Completed)   ECHOCARDIOGRAM COMPLETE   Myocardial Perfusion Imaging   Sleep apnea- on C-pap (Chronic)    Needs to be on CPAP. He wears most of the time      Relevant Orders   EKG 12-Lead (Completed)   ECHOCARDIOGRAM COMPLETE   Myocardial Perfusion Imaging      Current medicines are reviewed at length with the patient today. (+/- concerns) n/a The following changes have been made: None  Patient Instructions  SCHEDULE  [address] Your physician has requested that you have an echocardiogram. Echocardiography is a painless test that uses sound waves to create images of your heart. It provides your doctor with information about the size and shape of your heart and how well your heart's chambers and valves are working. This procedure takes approximately one hour. There are no restrictions for this  procedure.    SCHEDULE  AT [address] Your physician has requested that you have en exercise stress myoview. For further information please visit [email or web address] Please follow instruction sheet, as given. DO NOT TAKE METOPROLOL THE NIGHT  BEFORE OR THE MORNING OF THE TEST.   IF EITHER TEST ARE ABNORMAL ,WILL MAKE AN APPOINTMENT TO DISCUSS OTHERWISE   Your physician wants you to follow-up in 6 MONTH WITH DR HARDING. You will receive a reminder letter in the mail two months in advance. If you don't receive a letter, please call our office to schedule the follow-up appointment.   If you need a refill on your cardiac medications before your next appointment, please call your pharmacy.     Studies Ordered:   Orders Placed This Encounter  Procedures  . Myocardial Perfusion Imaging  . EKG 12-Lead  . ECHOCARDIOGRAM COMPLETE      Bryan Lemma, M.D., M.S. Interventional Cardiologist   Pager # [phone]

## 2016-09-21 ENCOUNTER — Encounter: Payer: Self-pay | Admitting: Cardiology

## 2016-09-21 NOTE — Assessment & Plan Note (Signed)
Needs to be on CPAP. He wears most of the time

## 2016-09-22 ENCOUNTER — Telehealth (HOSPITAL_COMMUNITY): Payer: Self-pay

## 2016-09-22 NOTE — Telephone Encounter (Signed)
Encounter complete. 

## 2016-09-27 ENCOUNTER — Ambulatory Visit (HOSPITAL_COMMUNITY)
Admission: RE | Admit: 2016-09-27 | Discharge: 2016-09-27 | Disposition: A | Discharge status: Home / Self Care | Payer: Medicare Other | Source: Ambulatory Visit | Attending: Cardiology | Admitting: Cardiology

## 2016-09-27 DIAGNOSIS — Z8249 Family history of ischemic heart disease and other diseases of the circulatory system: Secondary | ICD-10-CM | POA: Insufficient documentation

## 2016-09-27 DIAGNOSIS — I2510 Atherosclerotic heart disease of native coronary artery without angina pectoris: Secondary | ICD-10-CM

## 2016-09-27 DIAGNOSIS — I214 Non-ST elevation (NSTEMI) myocardial infarction: Secondary | ICD-10-CM

## 2016-09-27 DIAGNOSIS — E785 Hyperlipidemia, unspecified: Secondary | ICD-10-CM | POA: Diagnosis not present

## 2016-09-27 DIAGNOSIS — IMO0001 Reserved for inherently not codable concepts without codable children: Secondary | ICD-10-CM

## 2016-09-27 DIAGNOSIS — I10 Essential (primary) hypertension: Secondary | ICD-10-CM | POA: Diagnosis not present

## 2016-09-27 DIAGNOSIS — Z6841 Body Mass Index (BMI) 40.0 and over, adult: Secondary | ICD-10-CM | POA: Diagnosis not present

## 2016-09-27 DIAGNOSIS — G4733 Obstructive sleep apnea (adult) (pediatric): Secondary | ICD-10-CM

## 2016-09-27 DIAGNOSIS — Z951 Presence of aortocoronary bypass graft: Secondary | ICD-10-CM

## 2016-09-27 DIAGNOSIS — I359 Nonrheumatic aortic valve disorder, unspecified: Secondary | ICD-10-CM | POA: Diagnosis not present

## 2016-09-27 DIAGNOSIS — E669 Obesity, unspecified: Secondary | ICD-10-CM | POA: Insufficient documentation

## 2016-09-27 DIAGNOSIS — R0609 Other forms of dyspnea: Secondary | ICD-10-CM

## 2016-09-27 DIAGNOSIS — R0600 Dyspnea, unspecified: Secondary | ICD-10-CM

## 2016-09-27 MED ORDER — TECHNETIUM TC 99M TETROFOSMIN IV KIT
30.8000 | PACK | Freq: Once | INTRAVENOUS | Status: AC | PRN
  Administered 2016-09-27: 30.8 via INTRAVENOUS
  Filled 2016-09-27: qty 31

## 2016-09-28 ENCOUNTER — Ambulatory Visit (HOSPITAL_COMMUNITY)
Admission: RE | Admit: 2016-09-28 | Discharge: 2016-09-28 | Disposition: A | Discharge status: Home / Self Care | Payer: Medicare Other | Source: Ambulatory Visit | Attending: Cardiovascular Disease | Admitting: Cardiovascular Disease

## 2016-09-28 LAB — MYOCARDIAL PERFUSION IMAGING
CHL CUP NUCLEAR SSS: 0
CHL RATE OF PERCEIVED EXERTION: 18
CSEPEDS: 11 s
Estimated workload: 6.1 METS
Exercise duration (min): 5 min
LV dias vol: 108 mL (ref 62–150)
LVSYSVOL: 42 mL
MPHR: 152 {beats}/min
Peak HR: 139 {beats}/min
Percent HR: 91 %
Rest HR: 67 {beats}/min
SDS: 0
SRS: 0
TID: 1.06

## 2016-09-28 MED ORDER — TECHNETIUM TC 99M TETROFOSMIN IV KIT
29.5000 | PACK | Freq: Once | INTRAVENOUS | Status: AC | PRN
  Administered 2016-09-28: 29.5 via INTRAVENOUS

## 2016-10-03 ENCOUNTER — Other Ambulatory Visit: Payer: Self-pay | Admitting: Cardiology

## 2016-10-05 ENCOUNTER — Ambulatory Visit (HOSPITAL_COMMUNITY): Payer: Medicare Other | Attending: Cardiology

## 2016-10-05 ENCOUNTER — Other Ambulatory Visit: Payer: Self-pay

## 2016-10-05 DIAGNOSIS — I2510 Atherosclerotic heart disease of native coronary artery without angina pectoris: Secondary | ICD-10-CM

## 2016-10-05 DIAGNOSIS — I359 Nonrheumatic aortic valve disorder, unspecified: Secondary | ICD-10-CM

## 2016-10-05 DIAGNOSIS — I119 Hypertensive heart disease without heart failure: Secondary | ICD-10-CM | POA: Insufficient documentation

## 2016-10-05 DIAGNOSIS — Z8249 Family history of ischemic heart disease and other diseases of the circulatory system: Secondary | ICD-10-CM | POA: Diagnosis not present

## 2016-10-05 DIAGNOSIS — R0600 Dyspnea, unspecified: Secondary | ICD-10-CM

## 2016-10-05 DIAGNOSIS — G4733 Obstructive sleep apnea (adult) (pediatric): Secondary | ICD-10-CM | POA: Diagnosis not present

## 2016-10-05 DIAGNOSIS — E669 Obesity, unspecified: Secondary | ICD-10-CM

## 2016-10-05 DIAGNOSIS — Z6841 Body Mass Index (BMI) 40.0 and over, adult: Secondary | ICD-10-CM | POA: Insufficient documentation

## 2016-10-05 DIAGNOSIS — R0609 Other forms of dyspnea: Secondary | ICD-10-CM | POA: Diagnosis not present

## 2016-10-05 DIAGNOSIS — Z951 Presence of aortocoronary bypass graft: Secondary | ICD-10-CM | POA: Diagnosis not present

## 2016-10-05 DIAGNOSIS — I350 Nonrheumatic aortic (valve) stenosis: Secondary | ICD-10-CM | POA: Insufficient documentation

## 2016-10-05 DIAGNOSIS — IMO0001 Reserved for inherently not codable concepts without codable children: Secondary | ICD-10-CM

## 2016-10-05 DIAGNOSIS — R011 Cardiac murmur, unspecified: Secondary | ICD-10-CM | POA: Diagnosis present

## 2016-10-09 ENCOUNTER — Telehealth: Payer: Self-pay | Admitting: Cardiology

## 2016-10-09 NOTE — Telephone Encounter (Signed)
New Message     Please call him about test results

## 2016-10-09 NOTE — Telephone Encounter (Signed)
Results below relayed to pt. He will call back in June to schedule 6 month follow up appt  Notes Recorded by Marykay Lexavid W Harding, MD on 10/05/2016 at 5:03 PM EST Relatively normal echocardiogram with normal pump function - normal relaxation/diastolic function for age as well.[name]. Unfortunately difficult to read. It did look like he has maybe mild aortic stenosis. Something we will continue to follow. With a normal echocardiogram and nuclear stress test, I think the shortness breath with walking is probably not related to the heart as much it is related to being out of shape and weight. As planned, we will just continue following up as scheduled. Bryan Lemmaavid Harding, MD Notes Recorded by Marykay Lexavid W Harding, MD on 10/02/2016 at 12:41 PM EST Overall good results on the stress test. EKG section is concerning but that is overall by the fact of the images looked good. Normal pump function  Bryan Lemmaavid Harding, MD

## 2016-10-13 ENCOUNTER — Other Ambulatory Visit: Payer: Self-pay | Admitting: Cardiovascular Disease

## 2016-11-10 ENCOUNTER — Other Ambulatory Visit: Payer: Self-pay | Admitting: Cardiovascular Disease

## 2016-11-10 NOTE — Telephone Encounter (Signed)
REFILL 

## 2016-12-25 ENCOUNTER — Other Ambulatory Visit: Payer: Self-pay | Admitting: Cardiology

## 2016-12-25 NOTE — Telephone Encounter (Signed)
Rx request sent to pharmacy.  

## 2017-03-27 ENCOUNTER — Ambulatory Visit (INDEPENDENT_AMBULATORY_CARE_PROVIDER_SITE_OTHER): Payer: Medicare Other | Admitting: Cardiology

## 2017-03-27 ENCOUNTER — Encounter: Payer: Self-pay | Admitting: Cardiology

## 2017-03-27 VITALS — BP 116/66 | HR 84 | Ht 69.0 in | Wt 268.0 lb

## 2017-03-27 DIAGNOSIS — IMO0001 Reserved for inherently not codable concepts without codable children: Secondary | ICD-10-CM

## 2017-03-27 DIAGNOSIS — I25119 Atherosclerotic heart disease of native coronary artery with unspecified angina pectoris: Secondary | ICD-10-CM

## 2017-03-27 DIAGNOSIS — R0600 Dyspnea, unspecified: Secondary | ICD-10-CM

## 2017-03-27 DIAGNOSIS — E669 Obesity, unspecified: Secondary | ICD-10-CM

## 2017-03-27 DIAGNOSIS — I2510 Atherosclerotic heart disease of native coronary artery without angina pectoris: Secondary | ICD-10-CM | POA: Diagnosis not present

## 2017-03-27 DIAGNOSIS — I10 Essential (primary) hypertension: Secondary | ICD-10-CM | POA: Diagnosis not present

## 2017-03-27 DIAGNOSIS — I359 Nonrheumatic aortic valve disorder, unspecified: Secondary | ICD-10-CM | POA: Diagnosis not present

## 2017-03-27 DIAGNOSIS — R0609 Other forms of dyspnea: Secondary | ICD-10-CM | POA: Diagnosis not present

## 2017-03-27 DIAGNOSIS — E785 Hyperlipidemia, unspecified: Secondary | ICD-10-CM

## 2017-03-27 NOTE — Progress Notes (Signed)
PCP: Ardelle Balls, MD  Clinic Note: Chief Complaint  Patient presents with  . Follow-up    no complaint  . Coronary Artery Disease    h/o CABG  . Obesity    HPI: David Hogan is a 71 y.o. male with a PMH below who presents today for six-month follow-up of CAD-CABG.  David Hogan was last seen on 09/20/2016. He was noting exertional dyspneamost notably walking uphill. His wife claim that it was worse than usual. Evaluate with Myoview and echo  Recent Hospitalizations: none  Studies Personally Reviewed - (if available, images/films reviewed: From Epic Chart or Care Everywhere)  Myoview September 28, 2016: Normal EF 61%. Horizontal ST segment depression noted in inferior and lateral leads with no evidence of ischemia or infarction on imaging. LOW RISK.  2-D Echo 10/05/2016: Normal LVEF 60-65%. Normal diastolic function. (Poor acoustic windows) - Very mild AS  Interval History: Right returns today overall feeling pretty well. He has been trying to diet over last few months, and was working with a nutritionist who started him on a weight loss medication that was probably stop because of renal insufficiency. He is still however not doing active exercise. His wife is continuing to get on his case, but he is not started exercising. He however is not noticing the amount of exertional dyspnea since his losses weight and was happy to hear the results of the stress test.  He denies any PND, orthopnea or edema.  No rapid irregular heartbeats or palpitations.  No lightheadedness, dizziness, weakness or syncope/near syncope. No TIA/amaurosis fugax symptoms. No melena, hematochezia, hematuria, or epstaxis. No claudication. Asked about ? GOP.  ROS: A comprehensive was performed. Review of Systems  Constitutional: Negative for malaise/fatigue and weight loss (He lost the 20 pounds he had previously gained).  Respiratory: Negative for cough and wheezing.   Gastrointestinal: Positive  for heartburn and vomiting. Negative for nausea.  Genitourinary: Negative for frequency and hematuria.  Musculoskeletal: Positive for joint pain (Knees).  Psychiatric/Behavioral: Negative for memory loss. The patient is not nervous/anxious and does not have insomnia.   All other systems reviewed and are negative.  I have reviewed and (if needed) personally updated the patient's problem list, medications, allergies, past medical and surgical history, social and family history.   Past Medical History:  Diagnosis Date  . Asthma   . 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 Complication of anesthesia    "woke up wild in recovery after gallbladder OR" (04/08/2013)  . Exertional shortness of breath   . History of: Non-STEMI (non-ST elevated myocardial infarction) 04/08/2013   Down in the setting of syncope in reaction to heat exhaustion plus multiple bee stings  . Hypertension   . Mild aortic stenosis    Very Mild AS on Echo 09/2016  . OSA on CPAP   . 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 CARDIAC CATHETERIZATION  04/09/2013   Ostial and proximal LAD involving D1 80-90% stenoses, ramus intermedius proximal to ostial 90%. Otherwise mild diffuse disease in the circumflex and RCA just region.  . CARPAL TUNNEL RELEASE Left 1990's  . CHOLECYSTECTOMY  ~ 2010  . CORONARY ARTERY BYPASS GRAFT N/A 04/11/2013   Procedure: CORONARY ARTERY BYPASS GRAFTING (CABGx3: LIMA-LAD, SVG-RI, SVG-D1) ;  Surgeon: Delight Ovens,  MD;  Location: [facility] OR;  Service: Open Heart Surgery;  Laterality: N/A;  x3 using right greater saphenous vein and left internal mammary.  [name] HERNIA REPAIR  ~ 2012   "from the gallbladder OR" (04/08/2013)  . INCISION AND DRAINAGE OF WOUND  ~ 1996   "hit elbow at work; it swelled up; had it opened up to  get pus out" (04/08/2013)  . INTRAOPERATIVE TRANSESOPHAGEAL ECHOCARDIOGRAM N/A 04/11/2013   Procedure: INTRAOPERATIVE TRANSESOPHAGEAL ECHOCARDIOGRAM;  Surgeon: Delight Ovens, MD;  Location: [facility] OR;  Service: Open Heart Surgery;  Laterality: N/A;  . LEFT HEART CATHETERIZATION WITH CORONARY ANGIOGRAM N/A 04/09/2013   Procedure: LEFT HEART CATHETERIZATION WITH CORONARY ANGIOGRAM;  Surgeon: Marykay Lex, MD;  Location: [facility] CATH LAB;  Service: Cardiovascular;  Laterality: N/A;  . NM MYOVIEW LTD  09/2017   Normal EF 61%. Horizontal ST segment depression noted in inferior and lateral leads with no evidence of ischemia or infarction on imaging. LOW RISK.  [name] SHOULDER OPEN ROTATOR CUFF REPAIR Bilateral 1990's-2000's  . TRANSTHORACIC ECHOCARDIOGRAM  04/09/2013   EF 65-70%. Normal wall motion. Grade 1 diastolic dysfunction. Aortic sclerosis but no stenosis.  . TRANSTHORACIC ECHOCARDIOGRAM  09/2016   : Normal LVEF 60-65%. Normal diastolic function. (Poor acoustic windows). Very Mild AS    Current Meds  Medication Sig  . aspirin EC 81 MG tablet Take 1 tablet (81 mg total) by mouth daily.  [name] atorvastatin (LIPITOR) 80 MG tablet TAKE ONE TABLET BY MOUTH EVERY DAY AT 6PM  . benzonatate (TESSALON) 200 MG capsule Take 200 mg by mouth daily.   [name] EPIPEN 2-PAK 0.3 MG/0.3ML SOAJ injection   . fenofibrate 160 MG tablet TAKE ONE TABLET BY MOUTH EVERY DAY  . hydrochlorothiazide (HYDRODIURIL) 25 MG tablet TAKE ONE TABLET BY MOUTH EVERY DAY  . losartan (COZAAR) 100 MG tablet Take 100 mg by mouth daily.  . metoprolol tartrate (LOPRESSOR) 25 MG tablet TAKE ONE TABLET BY MOUTH 2 TIMES A DAY  . montelukast (SINGULAIR) 10 MG tablet Take 10 mg by mouth every morning.   [name] NITROSTAT 0.4 MG SL tablet PLACE 1 TABLET UNDER THE TONGUE EVERY 5 MINUTES AS NEEDED FOR CHEST PAIN  . Spacer/Aero-Holding Chambers (AEROCHAMBER PLUS FLO-VU) MISC Take 2 puffs by mouth as needed (SOB).     Allergies  Allergen Reactions  . Bee Venom Other  (See Comments)    Yellow jackets    Social History   Social History  . Marital status: Married    Spouse name: N/A  . Number of children: N/A  . Years of education: N/A   Social History Main Topics  . Smoking status: Never Smoker  . Smokeless tobacco: Never Used  . Alcohol use Yes     Comment: 04/09/2013 "last drink was in 1978 or so; never had a problem w/it"  . Drug use: No  . Sexual activity: Yes   Other Topics Concern  . None   Social History Narrative   Married for 32 years. No living children. The 2 grandchildren.    He is a retired Biochemist, clinical from [employer]   He never smoked. Does not drink alcohol.   He currently walks 2-3 times a day since discharge.   He now wears a baseball hat with several bees on it that says "who knew"    family history includes Heart attack in his brother and mother; Heart disease in his father; Stroke (age of onset: 70) in his mother.  Wt Readings from  Last 3 Encounters:  03/27/17 268 lb (121.6 kg)  09/27/16 280 lb (127 kg)  09/20/16 280 lb (127 kg)    PHYSICAL EXAM BP 116/66   Pulse 84   Ht 5\' 9"  (1.753 m)   Wt 268 lb (121.6 kg)   BMI 39.58 kg/m  Physical Exam  Constitutional: He is oriented to person, place, and time. He appears well-developed and well-nourished. No distress.  Borderline morbidly obese. Well groomed.  HENT:  Head: Normocephalic and atraumatic.  Has a nasally congested sounding voice  Eyes: EOM are normal.  Neck: Neck supple. No hepatojugular reflux and no JVD (Cannot assess) present. Carotid bruit is not present.  Cardiovascular: Normal rate, regular rhythm and normal pulses.  Exam reveals distant heart sounds. Exam reveals no gallop.   Murmur heard.  Medium-pitched harsh crescendo-decrescendo early systolic murmur is present with a grade of 2/6  at the upper right sternal border Pulmonary/Chest: Effort normal and breath sounds normal. No respiratory distress. He has no wheezes.  Distant  breath sounds  Abdominal: Soft. Bowel sounds are normal. He exhibits no distension. There is no tenderness. There is no rebound.  Neurological: He is alert and oriented to person, place, and time. No cranial nerve deficit.  Skin: Skin is warm and dry. No erythema.  Mild venous stasis changes  Psychiatric: He has a normal mood and affect. His behavior is normal. Judgment and thought content normal.  Nursing note and vitals reviewed. \  Adult ECG Report  Rate: 84 ;  Rhythm: normal sinus rhythm and normal intervals & durations. Normal Axis.;   Narrative Interpretation: normal EKG   Other studies Reviewed: Additional studies/ records that were reviewed today include:  Recent Labs:  Just checked today. Jan 2018 - TC 145, LDL 90  ASSESSMENT / PLAN: Problem List Items Addressed This Visit    Aortic valve disease - Aortic Sclerosis on Echo with increasing murmur. (Chronic)    Despite what sounded like notable AS on exam, only very mild stenosis noted on echo. Probably don't need to reevaluate for several years.      CAD, multiple vessel not amenable to PCI at cath 04/09/13 - Primary (Chronic)    Thankfully no anginal symptoms. He has chronic exertional dyspnea that is better since she's lost weight. Negative Myoview was relatively normal echocardiogram. He is on ARB, beta blocker and aspirin and statin. Overall stable cardiac standpoint. Main issue now is losing weight.      Dyslipidemia, goal LDL below 70 (Chronic)    Last evaluation as always in January and he was not at goal. He probably just had labs checked today. He is on atorvastatin and fenofibrate, if not at goal, may need to consider referral to [facility] ([facility]) for PCSK9 inhibitor initiation.  I am hoping that with weight loss his values will improve as well.      Essential hypertension (Chronic)    Well-controlled on current medications      Exertional dyspnea    Not  unexpectedly, with the mild amount of weight loss, he is noting improvement in his dyspnea. With a negative Myoview and relatively normal echocardiogram, most likely his reasoning for dyspnea is deconditioning and obesity.  - Discussed importance or weight loss. He also probably has some nasal septal obstruction which contributes to some of his dyspnea.      Relevant Orders   EKG 12-Lead (Completed)   Obesity, Class II, BMI 35-39.9, with comorbidity (Chronic)  I spent probably 20-25 minutes out of a 30+ minutes I spent with the patient discussing pros and cons of gastric bypass surgery as well as just the overall importance of dietary modification and exercise. Shooting first gradual weight loss. We discussed different diets and different exercise options.         Current medicines are reviewed at length with the patient today. (+/- concerns) n/a The following changes have been made: n/a  Patient Instructions  Your physician encouraged you to lose weight for better health. THE GOAL IS TO  LOSE 12 LBS IN 6 MONTHS   Your physician recommends that you schedule a follow-up appointment in 6 MONTHS WITH DR HARDING.     Studies Ordered:   Orders Placed This Encounter  Procedures  . EKG 12-Lead      Bryan Lemma, M.D., M.S. Interventional Cardiologist   Pager # [phone] Phone # [phone] [address] [place], [place] [address]

## 2017-03-27 NOTE — Patient Instructions (Signed)
Your physician encouraged you to lose weight for better health. THE GOAL IS TO  LOSE 12 LBS IN 6 MONTHS   Your physician recommends that you schedule a follow-up appointment in 6 MONTHS WITH DR HARDING.

## 2017-03-29 ENCOUNTER — Encounter: Payer: Self-pay | Admitting: Cardiology

## 2017-03-29 NOTE — Assessment & Plan Note (Addendum)
Despite what sounded like notable AS on exam, only very mild stenosis noted on echo. Probably don't need to reevaluate for several years.

## 2017-03-29 NOTE — Assessment & Plan Note (Signed)
Well-controlled on current medications 

## 2017-03-29 NOTE — Assessment & Plan Note (Signed)
Not unexpectedly, with the mild amount of weight loss, he is noting improvement in his dyspnea. With a negative Myoview and relatively normal echocardiogram, most likely his reasoning for dyspnea is deconditioning and obesity.  - Discussed importance or weight loss. He also probably has some nasal septal obstruction which contributes to some of his dyspnea.

## 2017-03-29 NOTE — Assessment & Plan Note (Signed)
I spent probably 20-25 minutes out of a 30+ minutes I spent with the patient discussing pros and cons of gastric bypass surgery as well as just the overall importance of dietary modification and exercise. Shooting first gradual weight loss. We discussed different diets and different exercise options.

## 2017-03-29 NOTE — Assessment & Plan Note (Addendum)
Last evaluation as always in January and he was not at goal. He probably just had labs checked today. He is on atorvastatin and fenofibrate, if not at goal, may need to consider referral to [facility] (our [facility]) for PCSK9 inhibitor initiation.  I am hoping that with weight loss his values will improve as well.

## 2017-03-29 NOTE — Assessment & Plan Note (Signed)
Thankfully no anginal symptoms. He has chronic exertional dyspnea that is better since she's lost weight. Negative Myoview was relatively normal echocardiogram. He is on ARB, beta blocker and aspirin and statin. Overall stable cardiac standpoint. Main issue now is losing weight.

## 2017-04-26 ENCOUNTER — Telehealth: Payer: Self-pay | Admitting: *Deleted

## 2017-04-26 NOTE — Telephone Encounter (Signed)
Received form for bariatric surgery - [facility]

## 2017-05-09 ENCOUNTER — Telehealth: Payer: Self-pay | Admitting: Cardiology

## 2017-05-09 NOTE — Telephone Encounter (Signed)
Spoke to patient . Information obtained . Bariatric surgery support letter  And last office note and medication list faxed  Patient  Aware. 

## 2017-05-09 NOTE — Telephone Encounter (Signed)
Follow up   Pt is returning call    

## 2017-05-09 NOTE — Telephone Encounter (Signed)
Spoke to patient . Information obtained . Bariatric surgery support letter  And last office note and medication list faxed  Patient  Aware.

## 2017-05-09 NOTE — Telephone Encounter (Signed)
Left message to cal back 

## 2017-05-09 NOTE — Telephone Encounter (Signed)
New message     Pt would like to know if the forms for his surgical clearance been filled out and returned yet? Pt states he dropped them off a week ago at the front desk and he has not heard anything

## 2017-05-15 ENCOUNTER — Other Ambulatory Visit: Payer: Self-pay | Admitting: Cardiology

## 2017-05-16 DIAGNOSIS — N182 Chronic kidney disease, stage 2 (mild): Secondary | ICD-10-CM | POA: Insufficient documentation

## 2017-05-23 ENCOUNTER — Other Ambulatory Visit: Payer: Self-pay | Admitting: Cardiology

## 2017-07-06 ENCOUNTER — Other Ambulatory Visit: Payer: Self-pay | Admitting: Cardiovascular Disease

## 2017-07-26 ENCOUNTER — Other Ambulatory Visit: Payer: Self-pay | Admitting: Cardiology

## 2017-08-21 ENCOUNTER — Other Ambulatory Visit: Payer: Self-pay | Admitting: Cardiology

## 2017-09-10 ENCOUNTER — Other Ambulatory Visit: Payer: Self-pay | Admitting: Cardiology

## 2017-09-10 NOTE — Telephone Encounter (Signed)
Rx request sent to pharmacy.  

## 2017-09-24 ENCOUNTER — Ambulatory Visit: Payer: Medicare Other | Admitting: Cardiology

## 2017-09-24 ENCOUNTER — Encounter: Payer: Self-pay | Admitting: Cardiology

## 2017-09-24 VITALS — BP 133/75 | HR 76 | Ht 68.0 in | Wt 267.2 lb

## 2017-09-24 DIAGNOSIS — Z951 Presence of aortocoronary bypass graft: Secondary | ICD-10-CM | POA: Diagnosis not present

## 2017-09-24 DIAGNOSIS — I10 Essential (primary) hypertension: Secondary | ICD-10-CM | POA: Diagnosis not present

## 2017-09-24 DIAGNOSIS — I359 Nonrheumatic aortic valve disorder, unspecified: Secondary | ICD-10-CM

## 2017-09-24 DIAGNOSIS — I214 Non-ST elevation (NSTEMI) myocardial infarction: Secondary | ICD-10-CM

## 2017-09-24 DIAGNOSIS — I25119 Atherosclerotic heart disease of native coronary artery with unspecified angina pectoris: Secondary | ICD-10-CM | POA: Diagnosis not present

## 2017-09-24 NOTE — Assessment & Plan Note (Signed)
No active anginal symptoms.  Myoview last year was negative for ischemia.  EF normal echo. Is on aspirin, statin, stable dose beta-blocker and ARB.  Major plan now is to continue weight loss.  Otherwise stable on current regimen.

## 2017-09-24 NOTE — Progress Notes (Signed)
PCP: Ardelle Balls, MD  Clinic Note: Chief Complaint  Patient presents with  . Follow-up    6 months;  . Coronary Artery Disease    History of CABG after mild non-STEMI    HPI: David Hogan is a 72 y.o. male with a PMH below who presents today for Routine six-month follow-up of CAD-CABG.  David Hogan was last seen on in August 2018. This was six-month follow-up after having Myoview and echocardiogram done.  Recent Hospitalizations: none --however he is recovering from a recent pneumonia.  Studies Personally Reviewed - (if available, images/films reviewed: From Epic Chart or Care Everywhere)  None  Interval History: David Hogan is here today really with no cardiac complaints.  He is currently a little bit under the weather had recovering from a pneumonia, but no further chills or fevers.  He is still coughing a little bit, but has come close to finishing his course of antibiotics.  This got a little bit out of his somewhat questionable exercise regimen.  His wife is somewhat skeptical of his exercise routine, stated he may be goes about 2 days a week to the gym.  He tells me in the gym he will do about 30 minutes of walking and about 30 minutes of weights, but no real routine.  He tries to go to 3 days a week, but really may be makes it twice a week.  Other than his recent pneumonia, he is bothered by a lot of congestion nasal and sinus that makes it hard for her to breathe but really has no other problems with exertional dyspnea.  He has not lost any weight since last I saw him, but that was after a 13 pound weight loss from the prior visit.   He denies any resting or exertional chest tightness or pressure.  Mild edema, but nothing significant.  No PND or orthopnea.  No rapid irregular heartbeats palpitations.  No syncope/near syncope or TIA/amaurosis fugax.  No melena, hematochezia or hematuria.  No epistaxis.  He has been seeing nutrition counseling and the bariatrics physicians,  but currently says that he is "on hold" debating about bariatric surgery.  Denies any claudication  ROS: A comprehensive was performed. Review of Systems  Constitutional: Negative for malaise/fatigue and weight loss (Essentially stable weight, is trying to lose).  HENT: Negative for congestion and nosebleeds.   Respiratory: Positive for cough (Recovering from pneumonia) and shortness of breath (From pneumonia, but not at baseline). Negative for sputum production and wheezing.   Gastrointestinal: Positive for heartburn. Negative for blood in stool, melena, nausea and vomiting.  Genitourinary: Negative for frequency and hematuria.  Musculoskeletal: Positive for joint pain (Knees).  Psychiatric/Behavioral: Negative for memory loss. The patient is not nervous/anxious and does not have insomnia.   All other systems reviewed and are negative.  I have reviewed and (if needed) personally updated the patient's problem list, medications, allergies, past medical and surgical history, social and family history.   Past Medical History:  Diagnosis Date  . Asthma   . 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 Complication of anesthesia    "woke up wild in recovery after gallbladder OR" (04/08/2013)  . Exertional shortness of breath   . History of: Non-STEMI (non-ST elevated myocardial infarction) 04/08/2013   Down in the setting of syncope in reaction to heat exhaustion plus multiple bee stings  .  Hypertension   . Mild aortic stenosis    Very Mild AS on Echo 09/2016  . OSA on CPAP   . 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 CARDIAC CATHETERIZATION  04/09/2013   Ostial and proximal LAD involving D1 80-90% stenoses, ramus intermedius proximal to ostial 90%. Otherwise mild diffuse disease in the circumflex and  RCA just region.  . CARPAL TUNNEL RELEASE Left 1990's  . CHOLECYSTECTOMY  ~ 2010  . CORONARY ARTERY BYPASS GRAFT N/A 04/11/2013   Procedure: CORONARY ARTERY BYPASS GRAFTING (CABGx3: LIMA-LAD, SVG-RI, SVG-D1) ;  Surgeon: Delight Ovens, MD;  Location: [facility] OR;  Service: Open Heart Surgery;  Laterality: N/A;  x3 using right greater saphenous vein and left internal mammary.  David Hogan HERNIA REPAIR  ~ 2012   "from the gallbladder OR" (04/08/2013)  . INCISION AND DRAINAGE OF WOUND  ~ 1996   "hit elbow at work; it swelled up; had it opened up to get pus out" (04/08/2013)  . INTRAOPERATIVE TRANSESOPHAGEAL ECHOCARDIOGRAM N/A 04/11/2013   Procedure: INTRAOPERATIVE TRANSESOPHAGEAL ECHOCARDIOGRAM;  Surgeon: Delight Ovens, MD;  Location: [facility] OR;  Service: Open Heart Surgery;  Laterality: N/A;  . LEFT HEART CATHETERIZATION WITH CORONARY ANGIOGRAM N/A 04/09/2013   Procedure: LEFT HEART CATHETERIZATION WITH CORONARY ANGIOGRAM;  Surgeon: Marykay Lex, MD;  Location: [facility] CATH LAB;  Service: Cardiovascular;  Laterality: N/A;  . NM MYOVIEW LTD  09/2016   Normal EF 61%. Horizontal ST segment depression noted in inferior and lateral leads with no evidence of ischemia or infarction on imaging. LOW RISK.  David Hogan SHOULDER OPEN ROTATOR CUFF REPAIR Bilateral 1990's-2000's  . TRANSTHORACIC ECHOCARDIOGRAM  04/09/2013   EF 65-70%. Normal wall motion. Grade 1 diastolic dysfunction. Aortic sclerosis but no stenosis.  . TRANSTHORACIC ECHOCARDIOGRAM  09/2016   : Normal LVEF 60-65%. Normal diastolic function. (Poor acoustic windows). Very Mild AS     Current Meds  Medication Sig  . aspirin EC 81 MG tablet Take 1 tablet (81 mg total) by mouth daily.  David Hogan atorvastatin (LIPITOR) 80 MG tablet TAKE ONE TABLET BY MOUTH EVERY DAY AT 6PM  . benzonatate (TESSALON) 200 MG capsule Take 200 mg by mouth daily.   David Hogan EPIPEN 2-PAK 0.3 MG/0.3ML SOAJ injection   . fenofibrate 160 MG tablet Take 1 tablet (160 mg total) by mouth daily.  . hydrochlorothiazide  (HYDRODIURIL) 25 MG tablet TAKE ONE TABLET BY MOUTH EVERY DAY  . losartan (COZAAR) 100 MG tablet Take 100 mg by mouth daily.  . metoprolol tartrate (LOPRESSOR) 25 MG tablet TAKE ONE TABLET BY MOUTH 2 TIMES A DAY  . metoprolol tartrate (LOPRESSOR) 25 MG tablet TAKE ONE TABLET BY MOUTH 2 TIMES A DAY  . montelukast (SINGULAIR) 10 MG tablet Take 10 mg by mouth every morning.   David Hogan NITROSTAT 0.4 MG SL tablet PLACE 1 TABLET UNDER THE TONGUE EVERY 5 MINUTES AS NEEDED FOR CHEST PAIN  . Spacer/Aero-Holding Chambers (AEROCHAMBER PLUS FLO-VU) MISC Take 2 puffs by mouth as needed (SOB).     Allergies  Allergen Reactions  . Bee Venom Other (See Comments)    Yellow jackets    Social History   Socioeconomic History  . Marital status: Married    Spouse name: None  . Number of children: None  . Years of education: None  . Highest education level: None  Social Needs  . Financial resource strain: None  . Food insecurity - worry:  None  . Food insecurity - inability: None  . Transportation needs - medical: None  . Transportation needs - non-medical: None  Occupational History  . None  Tobacco Use  . Smoking status: Never Smoker  . Smokeless tobacco: Never Used  Substance and Sexual Activity  . Alcohol use: Yes    Comment: 04/09/2013 "last drink was in 1978 or so; never had a problem w/it"  . Drug use: No  . Sexual activity: Yes  Other Topics Concern  . None  Social History Narrative   Married for 32 years. No living children. The 2 grandchildren.    He is a retired Biochemist, clinical from [employer]   He never smoked. Does not drink alcohol.   He currently walks 2-3 times a day since discharge.   He now wears a baseball hat with several bees on it that says "who knew"    family history includes Heart attack in his brother and mother; Heart disease in his father; Stroke (age of onset: 29) in his mother.  Wt Readings from Last 3 Encounters:  09/24/17 267 lb 3.2 oz (121.2 kg)    03/27/17 268 lb (121.6 kg)  09/27/16 280 lb (127 kg)    PHYSICAL EXAM BP 133/75   Pulse 76   Ht 5\' 8"  (1.727 m)   Wt 267 lb 3.2 oz (121.2 kg)   BMI 40.63 kg/m  Physical Exam  Constitutional: He is oriented to person, place, and time. He appears well-developed and well-nourished. No distress.  Borderline morbidly obese. Well groomed.  HENT:  Head: Normocephalic and atraumatic.  Has a nasally congested sounding voice  Eyes: EOM are normal.  Neck: Neck supple. No hepatojugular reflux and no JVD (Cannot assess) present. Carotid bruit is not present.  Cardiovascular: Normal rate, regular rhythm and normal pulses. Exam reveals distant heart sounds. Exam reveals no gallop.  Murmur heard.  Medium-pitched harsh crescendo-decrescendo early systolic murmur is present with a grade of 1/6 at the upper right sternal border. Pulmonary/Chest: Effort normal and breath sounds normal. No respiratory distress. He has no wheezes.  Distant breath sounds  Abdominal: Soft. Bowel sounds are normal. He exhibits no distension. There is no tenderness. There is no rebound.  Neurological: He is alert and oriented to person, place, and time. No cranial nerve deficit.  Skin: Skin is warm and dry. No erythema.  Mild venous stasis changes  Psychiatric: He has a normal mood and affect. His behavior is normal. Judgment and thought content normal.  Nursing note and vitals reviewed. \  Adult ECG Report  Rate: 84 ;  Rhythm: normal sinus rhythm and normal intervals & durations. Normal Axis.;   Narrative Interpretation: normal EKG   Other studies Reviewed: Additional studies/ records that were reviewed today include:  Recent Labs:  Just checked today.  09/07/2017: TC 126, TG 100, HDL 34, LDL 72 -from care everywhere  ASSESSMENT / PLAN: Problem List Items Addressed This Visit    Aortic valve disease - Aortic Sclerosis on Echo with increasing murmur. (Chronic)    May be very mild left AS on most recent echo.   Probably can follow-up in 2-3 years.  That would make it 2020-or 2021.      Relevant Orders   EKG 12-Lead   CAD, multiple vessel not amenable to PCI at cath 04/09/13 - Primary (Chronic)    No active anginal symptoms.  Myoview last year was negative for ischemia.  EF normal echo. Is on aspirin, statin, stable dose beta-blocker  and ARB.  Major plan now is to continue weight loss.  Otherwise stable on current regimen.      Relevant Orders   EKG 12-Lead   Essential hypertension (Chronic)    Well-controlled blood pressure on current dose of losartan, metoprolol and HCTZ.  Monitor.      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  Mild troponin elevation led to cardiac catheterization and then CABG.  No major infarct with minor troponin elevation and no regional wall motion normalities or infarct noted on stress test.      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  Recent treadmill Myoview last February was negative for ischemia.  Would be due for follow-up and hopefully 2022-2023 unless he has symptoms.      Relevant Orders   EKG 12-Lead      Current medicines are reviewed at length with the patient today. (+/- concerns) n/a The following changes have been made: n/a  Patient Instructions  NO CHANGES WITH CURRENT MEDICATIONS   Your physician wants you to follow-up in 12 MONTHS WITH DR Montavious Wierzba. You will receive a reminder letter in the mail two months in advance. If you don't receive a letter, please call our office to schedule the follow-up appointment.   If you need a refill on your cardiac medications before your next appointment, please call your pharmacy.    Studies Ordered:   Orders Placed This Encounter  Procedures  . EKG 12-Lead       Bryan Lemmaavid Padraig Nhan, M.D., M.S. Interventional Cardiologist   Pager # [phone] Phone # [phone] [address] [place], [place] [address]

## 2017-09-24 NOTE — Assessment & Plan Note (Signed)
Significant obesity.  He apparently has been seeing nutrition counseling and is having gone to bariatric surgical consultation.  For now he wants to try to see if he can do it on his own.  He seems to be doing okay with the dietary issues, but not really needs to pick up his exercise level.  We talked about the importance of getting at least 30 minutes walking every day if not more.  Also reiterated the importance of diet.

## 2017-09-24 NOTE — Patient Instructions (Addendum)
NO CHANGES WITH CURRENT MEDICATIONS   Your physician wants you to follow-up in 12 MONTHS WITH DR HARDING. You will receive a reminder letter in the mail two months in advance. If you don't receive a letter, please call our office to schedule the follow-up appointment.   If you need a refill on your cardiac medications before your next appointment, please call your pharmacy.  

## 2017-09-24 NOTE — Assessment & Plan Note (Signed)
Very unusual course of events where he had an allergic reaction to being stung by multiple bees or wasps.  Mild troponin elevation led to cardiac catheterization and then CABG.  No major infarct with minor troponin elevation and no regional wall motion normalities or infarct noted on stress test.

## 2017-09-24 NOTE — Assessment & Plan Note (Signed)
Well-controlled blood pressure on current dose of losartan, metoprolol and HCTZ.  Monitor.

## 2017-09-24 NOTE — Assessment & Plan Note (Signed)
Doing well post CABG.  Recent treadmill Myoview last February was negative for ischemia.  Would be due for follow-up and hopefully 2022-2023 unless he has symptoms.

## 2017-09-24 NOTE — Assessment & Plan Note (Signed)
May be very mild left AS on most recent echo.  Probably can follow-up in 2-3 years.  That would make it 2020-or 2021.

## 2017-10-25 ENCOUNTER — Other Ambulatory Visit: Payer: Self-pay | Admitting: Cardiology

## 2017-10-26 NOTE — Telephone Encounter (Signed)
Rx has been sent to the pharmacy electronically. ° °

## 2017-11-20 ENCOUNTER — Other Ambulatory Visit: Payer: Self-pay | Admitting: Cardiology

## 2018-03-12 IMAGING — NM NM MISC PROCEDURE
6 series · 36 of 36 positions shown · non-contrast
Comparison: none

[Series 1: wbr_s-proj_st wbr stress-gsp · 6.40mm/px · 6 of 512 frames shown]
[frame 43/512]
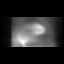
[frame 128/512]
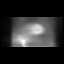
[frame 214/512]
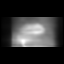
[frame 299/512]
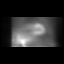
[frame 384/512]
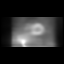
[frame 470/512]
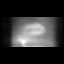

[Series 1: wbr stress-gsp · 6.40mm/px · 6 of 490 frames shown]
[frame 41/490  full-range]
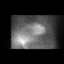
[frame 123/490  full-range]
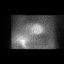
[frame 205/490  full-range]
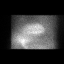
[frame 286/490  full-range]
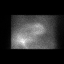
[frame 368/490  full-range]
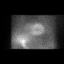
[frame 450/490  full-range]
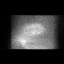

[Series 2: wbr_s-proj_st wbr stress-sum-em · 6.40mm/px · 6 of 64 frames shown]
[frame 6/64]
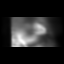
[frame 16/64]
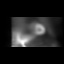
[frame 27/64]
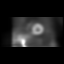
[frame 38/64]
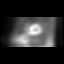
[frame 48/64]
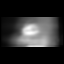
[frame 59/64]
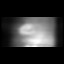

[Series 2: wbr stress-sum-em · 6.40mm/px · 6 of 64 frames shown]
[frame 6/64]
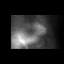
[frame 16/64]
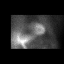
[frame 27/64]
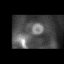
[frame 38/64]
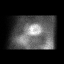
[frame 48/64]
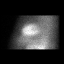
[frame 59/64]
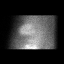

[Series 3: wbr_r-proj_st wbr rest · 6.40mm/px · 6 of 64 frames shown]
[frame 6/64]
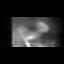
[frame 16/64]
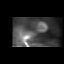
[frame 27/64]
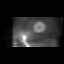
[frame 38/64]
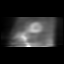
[frame 48/64]
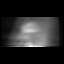
[frame 59/64]
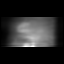

[Series 3: wbr rest · 6.40mm/px · 6 of 64 frames shown]
[frame 6/64]
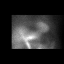
[frame 16/64]
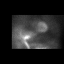
[frame 27/64]
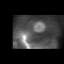
[frame 38/64]
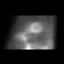
[frame 48/64]
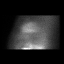
[frame 59/64]
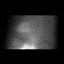

[36 of 36 positions shown; findings below may reference images not displayed]

Canned report from images found in remote index.

Refer to host system for actual result text.

## 2018-03-20 ENCOUNTER — Other Ambulatory Visit: Payer: Self-pay | Admitting: Cardiology

## 2018-04-02 ENCOUNTER — Telehealth: Payer: Self-pay | Admitting: Cardiology

## 2018-04-02 NOTE — Telephone Encounter (Signed)
New Message:   Pt would like to refill: benzonatate (TESSALON) 200 MG capsule  He states he was told to schedule appt but not due back until February

## 2018-04-02 NOTE — Telephone Encounter (Signed)
Spoke with pt. Adv pt that I do not see where Dr.Harding has refilled Tessalon in the past. Adv pt that it is a non-cardiac medication and should be refilled by his pcp. Pt sts that he may have contacted our office in error and will f/u with his pcp for his refill. Pt voiced appreciation for the call back

## 2018-05-07 ENCOUNTER — Other Ambulatory Visit: Payer: Self-pay | Admitting: Cardiology

## 2018-05-07 NOTE — Telephone Encounter (Signed)
Rx request sent to pharmacy.  

## 2018-06-24 ENCOUNTER — Other Ambulatory Visit: Payer: Self-pay | Admitting: Cardiology

## 2018-10-31 ENCOUNTER — Other Ambulatory Visit: Payer: Self-pay | Admitting: Cardiology

## 2018-12-19 ENCOUNTER — Other Ambulatory Visit: Payer: Self-pay | Admitting: Cardiology

## 2019-01-09 ENCOUNTER — Other Ambulatory Visit: Payer: Self-pay | Admitting: Cardiology

## 2019-03-26 ENCOUNTER — Telehealth: Payer: Self-pay | Admitting: *Deleted

## 2019-03-26 NOTE — Telephone Encounter (Signed)
Left message for patient to call and reschedule 05/05/19 8:40 am appointment with Dr. Ellyn Hack

## 2019-03-31 ENCOUNTER — Telehealth: Payer: Self-pay | Admitting: *Deleted

## 2019-03-31 NOTE — Telephone Encounter (Signed)
Called and informed patient per Dr Ellyn Hack an appointment is needed for clearance of bariatric surgery.  patient stated he has an appointment on 05/22/19. RN  Offered an earlier appt with  extender , patient decline .   Faxed  information to   [facility]  [facility]ariatric [facility] surgery   Phone [phone] Fax [phone]

## 2019-04-23 ENCOUNTER — Other Ambulatory Visit: Payer: Self-pay | Admitting: Cardiology

## 2019-05-05 ENCOUNTER — Ambulatory Visit: Payer: Medicare Other | Admitting: Cardiology

## 2019-05-08 HISTORY — PX: TRANSTHORACIC ECHOCARDIOGRAM: SHX275

## 2019-05-22 ENCOUNTER — Other Ambulatory Visit: Payer: Self-pay

## 2019-05-22 ENCOUNTER — Ambulatory Visit: Payer: Medicare Other | Admitting: Cardiology

## 2019-05-22 ENCOUNTER — Encounter: Payer: Self-pay | Admitting: Cardiology

## 2019-05-22 VITALS — BP 126/77 | HR 73 | Ht 67.0 in | Wt 270.0 lb

## 2019-05-22 DIAGNOSIS — Z951 Presence of aortocoronary bypass graft: Secondary | ICD-10-CM

## 2019-05-22 DIAGNOSIS — I359 Nonrheumatic aortic valve disorder, unspecified: Secondary | ICD-10-CM | POA: Diagnosis not present

## 2019-05-22 DIAGNOSIS — I25119 Atherosclerotic heart disease of native coronary artery with unspecified angina pectoris: Secondary | ICD-10-CM | POA: Diagnosis not present

## 2019-05-22 DIAGNOSIS — I10 Essential (primary) hypertension: Secondary | ICD-10-CM

## 2019-05-22 DIAGNOSIS — E785 Hyperlipidemia, unspecified: Secondary | ICD-10-CM

## 2019-05-22 DIAGNOSIS — G4733 Obstructive sleep apnea (adult) (pediatric): Secondary | ICD-10-CM

## 2019-05-22 DIAGNOSIS — Z01810 Encounter for preprocedural cardiovascular examination: Secondary | ICD-10-CM | POA: Diagnosis not present

## 2019-05-22 DIAGNOSIS — I214 Non-ST elevation (NSTEMI) myocardial infarction: Secondary | ICD-10-CM

## 2019-05-22 NOTE — Progress Notes (Signed)
PCP: Ardelle Balls, MD  He is being followed by [facility]) -Dr. Adolphus Birchwood, Irene Limbo, NP   Clinic Note: Chief Complaint  Patient presents with   Follow-up    Doing well.   Pre-op Exam    Bariatric surgery   Coronary Artery Disease    HPI:    David Hogan is a 73 y.o. 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 He is currently undergoing evaluation for bariatric surgery -[facility].  JESHUA RANSFORD was last seen in February 2019.  He has been well no cardiac complaints was dealing with pneumonia on antibiotics.  Noted this added to get him out of his exercise regimen which was treated at the gym 30 minutes walking some weights. -> Constant moderate migraines --> he said they were treated with injections.  Recent Hospitalizations: None  Reviewed  CV studies:   The following studies were reviewed today: (if available, images/films reviewed: From Epic Chart or Care Everywhere)  None since last visit:   Interval History:   Justen returns here today for follow-up, somewhat delayed because of the COVID-19 restrictions.  He tells me that he has been doing his best he can to try to lose weight, had been dropping and gaining and dropping then gaining.  Is got little bit frustrated he is now undergoing evaluation for possible bariatric surgery.  He does note though that he has been increasing his exercise level to the point where he is able to walk on a tractor about a mile and a half just about every day of the week.  Hopefully when the [facility] opens back up again he will be start doing some more circuit exercising and elliptical etc.  With this level of exercise, he does say that he has had much  improvement of his exertional dyspnea, and no chest pain or pressure with rest or exertion.  He still acknowledges because of his weight he has dyspnea that is complicated by his chronic allergy issues.  He still uses CPAP routinely.  No PND, orthopnea or edema.   Cardiovascular review of symptoms: Cardiovascular ROS: positive for - Some exertional dyspnea because of deconditioning, but not with routine exercise. negative for - chest pain, edema, irregular heartbeat, orthopnea, palpitations, paroxysmal nocturnal dyspnea, rapid heart rate, shortness of breath or syncope/near syncope, TIA/amaurosis fugax, claudication.  The patient does not have symptoms concerning for COVID-19 infection (fever, chills, cough, or new shortness of breath).  The patient is practicing social distancing. ++ Masking.  ++ still does - Groceries/shopping. Retired  REVIEWED OF SYSTEMS   ROS: A comprehensive was performed. Review of Systems  Constitutional: Negative for chills, fever, malaise/fatigue and weight loss (Trying).  HENT: Positive for congestion. Negative for nosebleeds and sinus pain.   Eyes: Negative for blurred vision.  Respiratory: Negative for cough.   Cardiovascular: Negative for leg swelling.  Gastrointestinal: Negative for blood in stool, diarrhea, heartburn (He does have some indigestion), melena, nausea and vomiting.  Genitourinary: Negative for dysuria and hematuria.  Musculoskeletal: Negative for falls, joint pain (His knee still hurt him because of his weight) and myalgias.  Neurological: Negative for dizziness, focal weakness and weakness.  Endo/Heme/Allergies: Positive for environmental allergies.  Psychiatric/Behavioral: Negative.  Negative for memory loss. The patient is not nervous/anxious and  does not have insomnia.   All other systems reviewed and are negative.  I have reviewed and (if needed) personally updated the patient's problem list, medications, allergies, past medical and  surgical history, social and family history.   PAST MEDICAL HISTORY   Past Medical History:  Diagnosis Date   Asthma    CAD, multiple vessel not amenable to PCI 04/12/2013   Referred for CABG -- Most recent Myoview 09/2016 - No ischemia or infarct (but abnormal EKG)   Complication of anesthesia    "woke up wild in recovery after gallbladder OR" (04/08/2013)   Exertional shortness of breath    History of: Non-STEMI (non-ST elevated myocardial infarction) 04/08/2013   Down in the setting of syncope in reaction to heat exhaustion plus multiple bee stings   Hypertension    Mild aortic stenosis    Very Mild AS on Echo 09/2016   OSA on CPAP    S/P CABG x 3, LIMA-LAD; VG-Diag; 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 Otherwise mild diffuse disease in the circumflex and RCA just region.   CARPAL TUNNEL RELEASE Left 1990's   CHOLECYSTECTOMY  ~ 2010   CORONARY ARTERY BYPASS GRAFT N/A 04/11/2013   Procedure: CORONARY ARTERY BYPASS GRAFTING (CABGx3: LIMA-LAD, SVG-RI, SVG-D1) ;  Surgeon: Delight OvensEdward B Gerhardt, MD;  [facility];  Service: Open Heart Surgery;  Laterality: N/A;  x3 using right greater saphenous vein and left internal mammary.   HERNIA REPAIR  ~ 2012   "from the gallbladder OR" (04/08/2013)   INCISION AND DRAINAGE OF WOUND  ~ 1996   "hit elbow at work; it swelled up; had it opened up to get pus out" (04/08/2013)   INTRAOPERATIVE TRANSESOPHAGEAL ECHOCARDIOGRAM N/A 04/11/2013   Procedure: INTRAOPERATIVE TRANSESOPHAGEAL ECHOCARDIOGRAM;  Surgeon: Delight OvensEdward B Gerhardt, MD;  [facility];  Service: Open Heart Surgery;  Laterality: N/A;   LEFT HEART CATHETERIZATION WITH CORONARY ANGIOGRAM N/A 04/09/2013   Procedure: LEFT HEART CATHETERIZATION WITH CORONARY ANGIOGRAM;  Surgeon:  Marykay Lexavid W Declan Adamson, MD;  [facility];  Service: Cardiovascular;  Laterality: N/A;   NM MYOVIEW LTD  09/2016   Normal EF 61%. Horizontal ST segment depression noted in inferior and lateral leads with no evidence of ischemia or infarction on imaging. LOW RISK.   SHOULDER OPEN ROTATOR CUFF REPAIR Bilateral 1990's-2000's   TRANSTHORACIC ECHOCARDIOGRAM  04/09/2013   EF 65-70%. Normal wall motion. Grade 1 diastolic dysfunction. Aortic sclerosis but no stenosis.   TRANSTHORACIC ECHOCARDIOGRAM  09/2016   : Normal LVEF 60-65%. Normal diastolic function. (Poor acoustic windows). Very Mild AS    MEDICATIONS/ALLERGIES   Current Meds  Medication Sig   aspirin EC 81 MG tablet Take 1 tablet (81 mg total) by mouth daily.   atorvastatin (LIPITOR) 80 MG tablet TAKE ONE TABLET BY MOUTH EVERY DAY AT 6PM   benzonatate (TESSALON) 200 MG capsule Take 200 mg by mouth daily.    EPIPEN 2-PAK 0.3 MG/0.3ML SOAJ injection    fenofibrate 160 MG tablet Take 1 tablet (160 mg total) by mouth daily.   hydrochlorothiazide (HYDRODIURIL) 25 MG tablet TAKE ONE TABLET BY MOUTH EVERY DAY   losartan (COZAAR) 100 MG tablet Take 100 mg by mouth daily.   metoprolol tartrate (LOPRESSOR) 25 MG tablet TAKE ONE TABLET BY MOUTH 2 TIMES A DAY  montelukast (SINGULAIR) 10 MG tablet Take 10 mg by mouth every morning.    nitroGLYCERIN (NITROSTAT) 0.4 MG SL tablet PLACE 1 TABLET UNDER THE TONGUE EVERY 5 MINUTES AS NEEDED FOR CHEST PAIN   Spacer/Aero-Holding Chambers (AEROCHAMBER PLUS FLO-VU) MISC Take 2 puffs by mouth as needed (SOB).    [DISCONTINUED] metoprolol tartrate (LOPRESSOR) 25 MG tablet TAKE ONE TABLET BY MOUTH 2 TIMES A DAY    Allergies  Allergen Reactions   Bee Venom Other (See Comments)    Yellow jackets    SOCIAL HISTORY/FAMILY HISTORY   Social History   Tobacco Use   Smoking status: Never Smoker   Smokeless tobacco: Never Used  Substance Use Topics   Alcohol use: Yes    Comment:  04/09/2013 "last drink was in 1978 or so; never had a problem w/it"   Drug use: No   Social History   Social History Narrative   Married for 32 years. No living children. The 2 grandchildren.    He is a retired Corporate treasurer from [employer]   He never smoked. Does not drink alcohol.   He currently walks 2-3 times a day since discharge.   He now wears a baseball hat with several bees on it that says "who knew"    family history includes Heart attack in his brother and mother; Heart disease in his father; Stroke (age of onset: 39) in his mother.   OBJCTIVE -PE, EKG, labs   Wt Readings from Last 3 Encounters:  05/22/19 270 lb (122.5 kg)  09/24/17 267 lb 3.2 oz (121.2 kg)  03/27/17 268 lb (121.6 kg)    Physical Exam: BP 126/77    Pulse 73    Ht 5\' 7"  (1.702 m)    Wt 270 lb (122.5 kg)    SpO2 97%    BMI 42.29 kg/m  Physical Exam  Constitutional: He is oriented to person, place, and time. He appears well-developed and well-nourished.  Morbidly obese gentleman.  Well-groomed.  HENT:  Head: Normocephalic and atraumatic.  He has chronic snuffling and sniffing because of congestion.  Exacerbated by mask.  Eyes: Pupils are equal, round, and reactive to light. EOM are normal.  Neck: Normal range of motion. Neck supple. No JVD present. Carotid bruit is not present.  Unable to really assess JVD or HJR.  Cardiovascular: Normal rate and regular rhythm.  No extrasystoles are present. PMI is not displaced (Unable to palpate). Exam reveals distant heart sounds and decreased pulses (Difficult to palpate pedal pulses because of mild venous stasis related swelling, and increased girth.). Exam reveals no gallop and no friction rub.  Murmur heard. High-pitched harsh crescendo-decrescendo midsystolic murmur is present with a grade of 1/6 at the upper right sternal border radiating to the neck. Pulmonary/Chest: Breath sounds normal. No respiratory distress. He has no wheezes. He has no  rales. He exhibits no tenderness.  Abdominal: Soft. Bowel sounds are normal. He exhibits no distension. There is no abdominal tenderness. There is no rebound.  Truncal obesity.  Unable to assess HSM  Musculoskeletal: Normal range of motion.        General: Edema (Trivial-1+ bilateral ankle) present.  Neurological: He is alert and oriented to person, place, and time.  Skin:  Mild venous stasis changes  Psychiatric: He has a normal mood and affect. His behavior is normal. Judgment and thought content normal.  Vitals reviewed.   Adult ECG Report  Rate: 73 ;  Rhythm: normal sinus rhythm, sinus arrhythmia and Low voltage.  Otherwise normal axis, intervals and durations.;   Narrative Interpretation: Stable EKG   Recent Labs:  Followed by PCP -> on Care Everywhere, pertinent labs reviewed below  August 22, 2018: Creatinine 1.2.;  March 03, 2019: K+ 3.3.  September 24, 2018 lipids: TC 115, TG 170, HDL 32, LDL 60.   ASSESSMENT/PLAN    Problem List Items Addressed This Visit    CAD, multiple vessel not amenable to PCI at cath 04/09/13 --> CABG - Primary (Chronic)    He is doing quite well.  No active angina symptoms.  Just a little bit of exertional dyspnea but not unexpected with body habitus.  He is actually walking a mile and a half without angina or dyspnea.   His blood pressures well controlled on HCTZ and low-dose Lopressor along with high-dose losartan.  On high-dose statin along with fenofibrate with well-controlled lipids.   Remains on aspirin, no longer on Thienopyridine antiplatelet agent.--Aspirin can be held for surgery if necessary (5- 7 days)  With him not having any active anginal symptoms, would not recommend ischemic evaluation at this time.  His last Myoview was in February 2018 that was nonischemic.  Would not be due for follow-up for another couple years.      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  Clinically would not reck Myoview until 2022 or 2023.  At this point, with no symptoms, it would not alter my management going forward.      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  The mild troponin elevation led to cath.  EF has been preserved and no ischemia or infarction noted on Myoview.  No regional wall motion abnormalities noted.  We will check a follow-up echocardiogram to ensure no new wall abnormalities.      Relevant Orders   EKG 12-Lead   ECHOCARDIOGRAM COMPLETE (Completed)   Obesity, Class II, BMI 35-39.9, with comorbidity (Chronic)   Dyslipidemia, goal LDL below 70 (Chronic)    Lipids look good.  He is on high-dose statin and high-dose fenofibrate.  No side effects, but need to closely follow LFTs.  Again with weight loss, hopefully we can potentially reduce doses.      Essential hypertension (Chronic)    Well-controlled on current meds.  No change      Aortic valve disease - Aortic Sclerosis on Echo with increasing murmur. (Chronic)    He had very mild aortic stenosis by echo.  Is been a while since his had another echo done plan was to recheck echo probably in 2020, but with his surgery pending, would probably check a screening echocardiogram just to confirm that is not worse.  This will also give [place] an assessment of his EF and pulmonary pressures.      Relevant Orders   EKG 12-Lead   ECHOCARDIOGRAM COMPLETE (Completed)   Sleep apnea- on C-pap (Chronic)    Doing well with CPAP.  Hopefully with weight loss from bariatric surgery, this could be improved and he may not require it further. With the presence of OSA, we do when I evaluate pulmonary pressures to ensure that no evidence of significant pulmonary pretension.  Plan: 2D echo      Preop cardiovascular exam    PREOPERATIVE  CARDIAC RISK ASSESSMENT  Revised Cardiac Risk Index:  High Risk Surgery: Yes  Defined as Intraperitoneal, intrathoracic or suprainguinal vascular  Active CAD: no; walking 1/2 miles a day without angina.  Ischemic evaluation less than 3 years ago with no evidence of ischemia.  CHF: no; normal EF.  Checking 2D echo  Cerebrovascular Disease: no;   Diabetes: no; On Insulin: no  CKD (Cr >~ 2): no; most recent creatinine was 1.2  Total: 1 Estimated Risk of Adverse Outcome: Class II risk due to intraperitoneal surgery, not patient based risk Estimated Risk of MI, PE, VF/VT (Cardiac Arrest), Complete Heart Block: ~6 %, reduced to 3% with longstanding beta-blocker. -->  Cannot reduce any further simply because of surgery   ACC/AHA Guidelines for "Clearance":  Step 1 - Need for Emergency Surgery: No: Elective  If Yes - go straight to OR with perioperative surveillance  Step 2 - Active Cardiac Conditions (Unstable Angina, Decompensated HF, Significant  Arrhytmias - Complete HB, Mobitz II, Symptomatic VT or SVT, Severe Aortic Stenosis - mean gradient > 40 mmHg, Valve area < 1.0 cm2):   No: No angina, no heart failure.  Has reported in minimal aortic stenosis.  Currently checking echocardiogram  If Yes - Evaluate & Treat per ACC/AHA Guidelines  Step 3 -  Low Risk Surgery: No: Intraperitoneal, high risk  If Yes --> proceed to OR  If No --> Step 4  Step 4 - Functional Capacity >= 4 METS without symptoms: Yes  If Yes --> proceed to OR  If No --> Step 5  Step 5 --  Clinical Risk Factors (CRF)   3 or more: No:   If Yes -- assess Surgical Risk, --   (High Risk Non-cardiac), Intraabdominal or thoracic vascular surgery consider testing if it will change management.  Intermediate Risk: Proceed to OR with HR control, or consider testing if it will change management  1-2 or more CRFs: No.  The only risk noted above was related to high risk surgery.  If Yes -- assess Surgical Risk,  --   (High Risk Non-cardiac), Intraabdominal or thoracic vascular surgery --> Proceed to OR, or consider testing if it will change management.  Intermediate Risk: Proceed to OR with HR control, or consider testing if it will change management  Based on this assessment, the recommendation with the patient having >4 METS, he should proceed to the OR without further evaluation.  He does have sleep apnea and is status post CABG.  We are checking a 2D echocardiogram just to ensure his EF is okay and his valve is fine.  He is not having any decompensated cardiac symptoms.  Provided his echocardiogram does not show severe aortic stenosis and has preserved EF, would recommend proceeding to the OR without any further evaluation.  Ischemic evaluation would not change management.  Bryan Lemma, MD       Relevant Orders   EKG 12-Lead   ECHOCARDIOGRAM COMPLETE (Completed)       COVID-19 Education: The signs and symptoms of COVID-19 were discussed with the patient and how to seek care for testing (follow up with PCP or arrange E-visit).   The importance of social distancing was discussed today.  I spent a total of with the patient and chart review. >  50% of the time was spent in direct patient consultation.  Additional time spent with chart review (studies, outside notes, etc): 14 Total Time: 36 min   Current medicines are reviewed at length with the patient today.  (+/- concerns) none  Patient Instructions / Medication Changes & Studies & Tests Ordered   Patient Instructions  Medication Instructions:  No changes  If you need a refill on your cardiac medications before your next appointment, please call your pharmacy.   Lab work:  Not  Needed   Testing/Procedures: Needed prior to clearance for surgery Your physician has requested that you have an echocardiogram. Echocardiography is a painless test that uses sound waves to create images of your heart. It provides your  doctor with information about the size and shape of your heart and how well your hearts chambers and valves are working. This procedure takes approximately one hour. There are no restrictions for this procedure.   Follow-Up: At [facility], you and your health needs are our priority.  As part of our continuing mission to provide you with exceptional heart care, we have created designated Provider Care Teams.  These Care Teams include your primary Cardiologist (physician) and Advanced Practice Providers (APPs -  Physician Assistants and Nurse Practitioners) who all work together to provide you with the care you need, when you need it. You will need a follow up appointment in  6 months- April 2021.  Please call our office 2 months in advance to schedule this appointment.  You will see Bryan Lemma, MD.     Any Other Special Instructions Will Be Listed Below (If Applicable).    Studies Ordered:   Orders Placed This Encounter  Procedures   EKG 12-Lead   ECHOCARDIOGRAM COMPLETE     Bryan Lemma, M.D., M.S. Interventional Cardiologist   Pager # [phone] Phone # [phone] [address] [place], [place] [address]   Thank you for choosing Heart[facility] at [facility]!!

## 2019-05-22 NOTE — Patient Instructions (Addendum)
Medication Instructions:  No changes  If you need a refill on your cardiac medications before your next appointment, please call your pharmacy.   Lab work:  Not  Needed   Testing/Procedures: Needed prior to clearance for surgery Your physician has requested that you have an echocardiogram. Echocardiography is a painless test that uses sound waves to create images of your heart. It provides your doctor with information about the size and shape of your heart and how well your heart's chambers and valves are working. This procedure takes approximately one hour. There are no restrictions for this procedure.   Follow-Up: At [facility], you and your health needs are our priority.  As part of our continuing mission to provide you with exceptional heart care, we have created designated Provider Care Teams.  These Care Teams include your primary Cardiologist (physician) and Advanced Practice Providers (APPs -  Physician Assistants and Nurse Practitioners) who all work together to provide you with the care you need, when you need it. You will need a follow up appointment in  6 months- April 2021.  Please call our office 2 months in advance to schedule this appointment.  You will see Glenetta Hew, MD.     Any Other Special Instructions Will Be Listed Below (If Applicable).

## 2019-05-23 ENCOUNTER — Ambulatory Visit (HOSPITAL_COMMUNITY): Payer: Medicare Other | Attending: Cardiology

## 2019-05-23 ENCOUNTER — Encounter: Payer: Self-pay | Admitting: Cardiology

## 2019-05-23 DIAGNOSIS — I359 Nonrheumatic aortic valve disorder, unspecified: Secondary | ICD-10-CM | POA: Diagnosis present

## 2019-05-23 DIAGNOSIS — I214 Non-ST elevation (NSTEMI) myocardial infarction: Secondary | ICD-10-CM | POA: Insufficient documentation

## 2019-05-23 DIAGNOSIS — Z01810 Encounter for preprocedural cardiovascular examination: Secondary | ICD-10-CM | POA: Insufficient documentation

## 2019-05-23 DIAGNOSIS — I25119 Atherosclerotic heart disease of native coronary artery with unspecified angina pectoris: Secondary | ICD-10-CM

## 2019-05-23 MED ORDER — PERFLUTREN LIPID MICROSPHERE
1.0000 mL | INTRAVENOUS | Status: AC | PRN
  Administered 2019-05-23: 1 mL via INTRAVENOUS
  Administered 2019-05-23: 2 mL via INTRAVENOUS

## 2019-05-23 NOTE — Assessment & Plan Note (Signed)
Last ischemic evaluation was in 2018.  Clinically would not reck Myoview until 2022 or 2023.  At this point, with no symptoms, it would not alter my management going forward.

## 2019-05-23 NOTE — Assessment & Plan Note (Signed)
Lipids look good.  He is on high-dose statin and high-dose fenofibrate.  No side effects, but need to closely follow LFTs.  Again with weight loss, hopefully we can potentially reduce doses.

## 2019-05-23 NOTE — Assessment & Plan Note (Signed)
Well-controlled on current meds.  No change 

## 2019-05-23 NOTE — Assessment & Plan Note (Addendum)
Relatively small mild non-STEMI related to allergy reaction.  The mild troponin elevation led to cath.  EF has been preserved and no ischemia or infarction noted on Myoview.  No regional wall motion abnormalities noted.  We will check a follow-up echocardiogram to ensure no new wall abnormalities.

## 2019-05-23 NOTE — Assessment & Plan Note (Signed)
He had very mild aortic stenosis by echo.  Is been a while since his had another echo done plan was to recheck echo probably in 2020, but with his surgery pending, would probably check a screening echocardiogram just to confirm that is not worse.  This will also give [place] an assessment of his EF and pulmonary pressures.

## 2019-05-23 NOTE — Assessment & Plan Note (Signed)
Doing well with CPAP.  Hopefully with weight loss from bariatric surgery, this could be improved and he may not require it further. With the presence of OSA, we do when I evaluate pulmonary pressures to ensure that no evidence of significant pulmonary pretension.  Plan: 2D echo

## 2019-05-23 NOTE — Assessment & Plan Note (Addendum)
He is doing quite well.  No active angina symptoms.  Just a little bit of exertional dyspnea but not unexpected with body habitus.  He is actually walking a mile and a half without angina or dyspnea.   His blood pressures well controlled on HCTZ and low-dose Lopressor along with high-dose losartan.  On high-dose statin along with fenofibrate with well-controlled lipids.   Remains on aspirin, no longer on Thienopyridine antiplatelet agent.--Aspirin can be held for surgery if necessary (5- 7 days)  With him not having any active anginal symptoms, would not recommend ischemic evaluation at this time.  His last Myoview was in February 2018 that was nonischemic.  Would not be due for follow-up for another couple years.

## 2019-05-23 NOTE — Assessment & Plan Note (Signed)
PREOPERATIVE CARDIAC RISK ASSESSMENT   Revised Cardiac Risk Index:  High Risk Surgery: Yes  Defined as Intraperitoneal, intrathoracic or suprainguinal vascular  Active CAD: no; walking 1/2 miles a day without angina.  Ischemic evaluation less than 3 years ago with no evidence of ischemia.  CHF: no; normal EF.  Checking 2D echo  Cerebrovascular Disease: no;   Diabetes: no; On Insulin: no  CKD (Cr >~ 2): no; most recent creatinine was 1.2  Total: 1 Estimated Risk of Adverse Outcome: Class II risk due to intraperitoneal surgery, not patient based risk Estimated Risk of MI, PE, VF/VT (Cardiac Arrest), Complete Heart Block: ~6 %, reduced to 3% with longstanding beta-blocker. -->  Cannot reduce any further simply because of surgery   ACC/AHA Guidelines for "Clearance":  Step 1 - Need for Emergency Surgery: No: Elective  If Yes - go straight to OR with perioperative surveillance  Step 2 - Active Cardiac Conditions (Unstable Angina, Decompensated HF, Significant  Arrhytmias - Complete HB, Mobitz II, Symptomatic VT or SVT, Severe Aortic Stenosis - mean gradient > 40 mmHg, Valve area < 1.0 cm2):   No: No angina, no heart failure.  Has reported in minimal aortic stenosis.  Currently checking echocardiogram  If Yes - Evaluate & Treat per ACC/AHA Guidelines  Step 3 -  Low Risk Surgery: No: Intraperitoneal, high risk  If Yes --> proceed to OR  If No --> Step 4  Step 4 - Functional Capacity >= 4 METS without symptoms: Yes  If Yes --> proceed to OR  If No --> Step 5  Step 5 --  Clinical Risk Factors (CRF)   3 or more: No:   If Yes -- assess Surgical Risk, --   (High Risk Non-cardiac), Intraabdominal or thoracic vascular surgery consider testing if it will change management.  Intermediate Risk: Proceed to OR with HR control, or consider testing if it will change management  1-2 or more CRFs: No.  The only risk noted above was related to high risk surgery.  If Yes -- assess  Surgical Risk, --   (High Risk Non-cardiac), Intraabdominal or thoracic vascular surgery --> Proceed to OR, or consider testing if it will change management.  Intermediate Risk: Proceed to OR with HR control, or consider testing if it will change management  Based on this assessment, the recommendation with the patient having >4 METS, he should proceed to the OR without further evaluation.  He does have sleep apnea and is status post CABG.  We are checking a 2D echocardiogram just to ensure his EF is okay and his valve is fine.  He is not having any decompensated cardiac symptoms.  Provided his echocardiogram does not show severe aortic stenosis and has preserved EF, would recommend proceeding to the OR without any further evaluation.  Ischemic evaluation would not change management.  Glenetta Hew, MD

## 2019-05-27 ENCOUNTER — Telehealth: Payer: Self-pay | Admitting: *Deleted

## 2019-05-27 NOTE — Telephone Encounter (Signed)
faxed clearance anf last office , echo and ekg report to [facility]

## 2019-05-27 NOTE — Telephone Encounter (Signed)
Spoke to patient - he is aware  information was faxed to [facility]  Bariatric surgery   giving clearance.

## 2019-06-23 ENCOUNTER — Other Ambulatory Visit: Payer: Self-pay | Admitting: Cardiology

## 2019-07-09 HISTORY — PX: OTHER SURGICAL HISTORY: SHX169

## 2019-07-14 ENCOUNTER — Telehealth: Payer: Self-pay | Admitting: Cardiology

## 2019-07-14 NOTE — Telephone Encounter (Signed)
Yes DH 

## 2019-07-14 NOTE — Telephone Encounter (Signed)
New Message   Pt c/o medication issue:  1. Name of Medication: aspirin EC 81 MG tablet   2. How are you currently taking this medication (dosage and times per day)?   3. Are you having a reaction (difficulty breathing--STAT)?   4. What is your medication issue? Patient states that he had a surgical procedure and was told to come off his aspirin. He is wanting to know should he restart the medication. Please call

## 2019-07-14 NOTE — Telephone Encounter (Signed)
Returned call to pt he states that he had his bariatric surgery and was wondering if he should restart the ASA? Please advise

## 2019-08-13 DIAGNOSIS — Z9884 Bariatric surgery status: Secondary | ICD-10-CM | POA: Insufficient documentation

## 2019-08-13 DIAGNOSIS — Z903 Acquired absence of stomach [part of]: Secondary | ICD-10-CM | POA: Insufficient documentation

## 2019-10-08 DIAGNOSIS — N201 Calculus of ureter: Secondary | ICD-10-CM | POA: Insufficient documentation

## 2019-12-09 ENCOUNTER — Other Ambulatory Visit: Payer: Self-pay

## 2019-12-09 ENCOUNTER — Ambulatory Visit: Payer: Medicare Other | Admitting: Cardiology

## 2019-12-09 ENCOUNTER — Encounter: Payer: Self-pay | Admitting: Cardiology

## 2019-12-09 VITALS — BP 120/75 | HR 62 | Resp 18 | Ht 67.0 in | Wt 216.4 lb

## 2019-12-09 DIAGNOSIS — I25119 Atherosclerotic heart disease of native coronary artery with unspecified angina pectoris: Secondary | ICD-10-CM | POA: Diagnosis not present

## 2019-12-09 DIAGNOSIS — I359 Nonrheumatic aortic valve disorder, unspecified: Secondary | ICD-10-CM

## 2019-12-09 DIAGNOSIS — Z951 Presence of aortocoronary bypass graft: Secondary | ICD-10-CM | POA: Diagnosis not present

## 2019-12-09 DIAGNOSIS — I214 Non-ST elevation (NSTEMI) myocardial infarction: Secondary | ICD-10-CM

## 2019-12-09 DIAGNOSIS — E785 Hyperlipidemia, unspecified: Secondary | ICD-10-CM | POA: Diagnosis not present

## 2019-12-09 DIAGNOSIS — I350 Nonrheumatic aortic (valve) stenosis: Secondary | ICD-10-CM | POA: Diagnosis not present

## 2019-12-09 DIAGNOSIS — I10 Essential (primary) hypertension: Secondary | ICD-10-CM

## 2019-12-09 DIAGNOSIS — G4733 Obstructive sleep apnea (adult) (pediatric): Secondary | ICD-10-CM

## 2019-12-09 NOTE — Progress Notes (Signed)
Primary Care Provider: Ardelle Balls, MD Cardiologist: No primary care provider on file. Electrophysiologist: None  Clinic Note: Chief Complaint  Patient presents with  . Follow-up    46-month and post GOP  . Coronary Artery Disease    No angina    HPI:    David Hogan is a 74 y.o. 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  He was in the process of doing his evaluation for bariatric surgery trying to work on his weight loss.  He was exercising routinely on the track about a mile to mile and a half a day most days a week as well as on the elliptical and circuit training.  No chest pain or pressure.  No issues with CPAP-Only noted issues with allergy related congestion.. --> 2D echo ordered as part of preop evaluation.  Recent Hospitalizations:   Admitted to bariatric surgery on July 09, 2019 (Laparoscopic Sleeve Gastric Bypass) -> has had excellent outcome.  No major issues from a bariatric standpoint.  ER visit in February for lumbar strain, followed by renal colic with nephrolithiasis.  Reviewed  CV studies:    The following studies were reviewed today: (if available, images/films reviewed: From Epic Chart or Care Everywhere) .  May 23, 2019 - Echocardiogram: EF 60 to 65%.  Moderate posterior wall thickness but no evidence of LVH.  GR 1 DD.  Normal RV size and function.  Normal atrial size.  Normal mitral valve.  Moderate thickening and calcification of the noncoronary cusp of the aortic valve => MILD AORTIC STENOSIS (mean gradient 13.5 mmHg.  (Stable from 2018).  Interval History:   David Hogan returns here today for  cardiology valuation doing amazingly well.  He is in great spirits.  He looks great he is lost about 70 pounds.  He feels great.  No adverse effects from the surgery.  Very happy with the results.  He is he is full of smiles when I see him today.  He is walking now a mile and 1/2 to 2 miles a day as well as doing his gym exercises.  He has no cardiac symptoms.  Other than the nephrolithiasis issues, he has had a great last several months.  He says his energy level is extremely improved to the point where he feels like he can do just about anything now.  He feels much younger and stronger.  He is tolerating the altered diet without any difficulties.  He still uses CPAP although he probably may not need CPAP for OSA with weight loss.  CardiovascularReview of Symptoms (Summary): no chest pain or dyspnea on exertion negative for - edema, irregular heartbeat, orthopnea, palpitations, paroxysmal nocturnal dyspnea, rapid heart rate, shortness of breath or Syncope/near syncope, TIA/amaurosis fugax, claudication  The patient does not have symptoms concerning for COVID-19 infection (fever, chills, cough, or new shortness of breath).   The patient is practicing social distancing & Masking.  He has had his vaccines.  REVIEWED OF SYSTEMS   ROS   I have reviewed and (if needed) personally updated the patient's problem list, medications, allergies, past medical and surgical history, social and family history.   PAST MEDICAL HISTORY   Past  Medical History:  Diagnosis Date  . Asthma   . 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 Complication of anesthesia    "woke up wild in recovery after gallbladder OR" (04/08/2013)  . Exertional shortness of breath   . History of: Non-STEMI (non-ST elevated myocardial infarction) 04/08/2013   Down in the setting of syncope in reaction to heat exhaustion plus multiple  bee stings  . Hypertension   . Mild aortic stenosis    Very Mild AS on Echo 09/2016  . OSA on CPAP   . 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 CARDIAC CATHETERIZATION  04/09/2013   Ostial and proximal LAD involving D1 80-90% stenoses, ramus intermedius proximal to ostial 90%. Otherwise mild diffuse disease in the circumflex and RCA just region.  . CARPAL TUNNEL RELEASE Left 1990's  . CHOLECYSTECTOMY  ~ 2010  . CORONARY ARTERY BYPASS GRAFT N/A 04/11/2013   Procedure: CORONARY ARTERY BYPASS GRAFTING (CABGx3: LIMA-LAD, SVG-RI, SVG-D1) ;  Surgeon: Delight Ovens, MD;  Location: [facility] OR;  Service: Open Heart Surgery;  Laterality: N/A;  x3 using right greater saphenous vein and left internal mammary.  [name] HERNIA REPAIR  ~ 2012   "from the gallbladder OR" (04/08/2013)  . INCISION AND DRAINAGE OF WOUND  ~ 1996   "hit elbow at work; it swelled up; had it opened up to get pus out" (04/08/2013)  . INTRAOPERATIVE TRANSESOPHAGEAL ECHOCARDIOGRAM N/A 04/11/2013   Procedure: INTRAOPERATIVE TRANSESOPHAGEAL ECHOCARDIOGRAM;  Surgeon: Delight Ovens, MD;  Location: [facility] OR;  Service: Open Heart Surgery;  Laterality: N/A;  . LEFT HEART CATHETERIZATION WITH CORONARY ANGIOGRAM N/A 04/09/2013   Procedure: LEFT HEART CATHETERIZATION WITH CORONARY ANGIOGRAM;  Surgeon: Marykay Lex, MD;  Location: [facility] CATH LAB;  Service: Cardiovascular;  Laterality: N/A;  . NM MYOVIEW LTD  09/2016   Normal EF 61%. Horizontal ST segment depression noted in inferior and lateral leads with no evidence of ischemia or infarction on imaging. LOW RISK.  [name] SHOULDER OPEN ROTATOR CUFF REPAIR Bilateral 1990's-2000's  . TRANSTHORACIC ECHOCARDIOGRAM  09/'14; 2/'18:   a) EF 65-70%. Normal wall motion. Grade 1 diastolic dysfunction. Aortic sclerosis but no stenosis;; b) Normal LVEF 60-65%. Normal diastolic function. (Poor acoustic windows).  Very Mild AS  . TRANSTHORACIC ECHOCARDIOGRAM  05/2019   Echocardiogram: EF 60 to 65%.  Moderate posterior wall thickness but no evidence of LVH.  GR 1 DD.  Normal RV size and function.  Normal atrial size.  Normal mitral valve.  Moderate thickening and calcification of the noncoronary cusp of the aortic valve => MILD AORTIC STENOSIS (mean gradient 13.5 mmHg.  (Stable from 2018).    MEDICATIONS/ALLERGIES   Current Meds  Medication Sig  . aprepitant (EMEND) 40 MG capsule Take 40 mg by mouth every morning.  [name] aspirin EC 81 MG tablet Take 1 tablet (81 mg total) by mouth daily.  [name] atorvastatin (LIPITOR) 80 MG tablet TAKE ONE TABLET BY MOUTH EVERY DAY AT 6PM  . benzonatate (TESSALON) 200 MG capsule Take 200 mg by mouth daily.   . cyanocobalamin 100 MCG tablet Take by mouth.  . EPIPEN 2-PAK 0.3 MG/0.3ML SOAJ injection   . fenofibrate 160 MG tablet Take 1 tablet (160 mg total) by mouth daily.  . HONEY BEE VENOM IJ  Inject as directed.  . hydrochlorothiazide (HYDRODIURIL) 25 MG tablet TAKE ONE TABLET BY MOUTH EVERY DAY  . ibuprofen (ADVIL) 800 MG tablet Take by mouth.  . levofloxacin (LEVAQUIN) 500 MG tablet Take 500 mg by mouth at bedtime.  [name] losartan (COZAAR) 100 MG tablet Take 100 mg by mouth daily.  . metoprolol tartrate (LOPRESSOR) 25 MG tablet TAKE ONE TABLET BY MOUTH 2 TIMES A DAY  . montelukast (SINGULAIR) 10 MG tablet Take 10 mg by mouth every morning.   . nitroGLYCERIN (NITROSTAT) 0.4 MG SL tablet PLACE 1 TABLET UNDER THE TONGUE EVERY 5 MINUTES AS NEEDED FOR CHEST PAIN  . Spacer/Aero-Holding Chambers (AEROCHAMBER PLUS FLO-VU) MISC Take 2 puffs by mouth as needed (SOB).   . Vitamin D3 (VITAMIN D) 25 MCG tablet Take one tablet (2,000 Units dose) by mouth 2 (two) times daily.    Allergies  Allergen Reactions  . Bee Venom Other (See Comments)    Yellow jackets    SOCIAL HISTORY/FAMILY HISTORY   Reviewed in Epic:  Pertinent findings: No new changes.  OBJCTIVE -PE, EKG, labs   Wt  Readings from Last 3 Encounters:  12/09/19 216 lb 6.4 oz (98.2 kg)  05/22/19 270 lb (122.5 kg)  09/24/17 267 lb 3.2 oz (121.2 kg)    Physical Exam: BP 120/75   Pulse 62   Resp 18   Ht 5\' 7"  (1.702 m)   Wt 216 lb 6.4 oz (98.2 kg)   SpO2 97%   BMI 33.89 kg/m  Physical Exam  Constitutional: He is oriented to person, place, and time. He appears well-developed and well-nourished. No distress.  Healthy-appearing.  Well-groomed.  Notable weight loss.  Yet he still does not look emaciated.  HENT:  Head: Normocephalic and atraumatic.  Still has his chronic congestion and stuffy snuffling.  Notably improved however.  Neck: No hepatojugular reflux and no JVD present. Carotid bruit is not present.  Cardiovascular: Normal rate, regular rhythm, S1 normal, S2 normal and intact distal pulses.  No extrasystoles are present. PMI is not displaced. Exam reveals no gallop and no friction rub.  Murmur heard. High-pitched harsh early systolic murmur is present with a grade of 1/6 at the upper right sternal border radiating to the neck. Pulmonary/Chest: Effort normal and breath sounds normal. No respiratory distress. He has no wheezes. He has no rales.  Abdominal: Soft. Bowel sounds are normal. He exhibits no distension. There is no abdominal tenderness. There is no rebound.  Musculoskeletal:        General: No edema. Normal range of motion.     Cervical back: Normal range of motion and neck supple.  Neurological: He is alert and oriented to person, place, and time.  Psychiatric: He has a normal mood and affect. His behavior is normal. Judgment and thought content normal.  Vitals reviewed.    Adult ECG Report  Rate: 55 ;  Rhythm: sinus bradycardia, sinus arrhythmia, premature ventricular contractions (PVC) and Incomplete RBBB.  Stable septal T wave inversions.  Otherwise normal axis, intervals and durations.;   Narrative Interpretation: Stable EKG.  Recent Labs:  Due to recheck next month with  PCP.   August 22, 2018: Creatinine 1.2.;  March 03, 2019: K+ 3.3.  September 24, 2018 lipids: TC 115, TG 170, HDL 32, LDL 60. 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  No angina symptoms.  Fully exercising and doing well.  Nonischemic Myoview and normal wall motion on echo.  He is now walking 1.5 to 2 miles a day as well as other exertional exercises.  Feeling much better energy level better.  Plan: Continue current dose of beta-blocker and ARB along with HCTZ for well-controlled blood pressure  Continue lipid management for now as long as he is tolerating without any LFT abnormalities.  Remains on maintenance dose aspirin (which can be held for procedures or surgeries)  Last Myoview was in February 2018.  Unless symptoms warrant, would probably not be due until February 2022-we can discuss in follow-up.      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  Found to have multivessel CAD and referred for CABG.  No sign of residual ischemia or infarct on Myoview.  No regional wall motion normalities on echo.      Obesity, Class II, BMI 35-39.9, with comorbidity (Chronic)    No longer morbidly obese post GOP.  Simply in the  obese spectrum, however is close to 70 pounds down from presurgical weight.  Tolerating his Diet well.  No issues.      Dyslipidemia, goal LDL below 70 (Chronic)    Remains on high-dose statin and high-dose fenofibrate.  Has not had labs checked since last July.  He is due to get labs checked within a month or so by his PCP.  He is actually supposed be going to see him relatively soon.  Need to closely monitor side effects from combination of fenofibrate and atorvastatin.  LDL has been pretty well controlled to date.      Essential hypertension (Chronic)    Blood pressure looks great on current medications.  I suspect that with weight loss we may need to eventually come off of his medications or reduced doses.  He is on high-dose losartan along with HCTZ and low-dose metoprolol.  No changes for now.      Mild aortic valve stenosis (Chronic)    Pretty much stable on echo with a mean gradient of only 13 mmHg.  We will follow by exam.  Probably would not need to recheck an echocardiogram for least another 2 to 3 years.      Sleep apnea- on C-pap (Chronic)    Tolerating CPAP.  No signs or symptoms of right heart failure/pulmonary hypertension          COVID-19 Education: The signs and symptoms of COVID-19 were discussed with the patient and how to seek care for testing (follow up with PCP or arrange E-visit).   The importance of social distancing and COVID-19 vaccination was discussed today.  I spent a total of with the patient. >  50% of the time was spent in direct  patient consultation.  Additional time spent with chart review  / charting (studies, outside notes, etc): 10 Total Time: 32 min   Current medicines are reviewed at length with the patient today.  (+/- concerns) none  Notice: This dictation was prepared with Dragon dictation along with smaller phrase technology. Any transcriptional errors that result from this process are unintentional and may not be corrected  upon review.  Patient Instructions / Medication Changes & Studies & Tests Ordered   Patient Instructions  Medication Instructions:  No changes  *If you need a refill on your cardiac medications before your next appointment, please call your pharmacy*   Lab Work: Please have your  primary send us a copy  Once it is completed. .    Testing/Procedures: Not needed   Follow-Up: At [facility], you and your health needs are our priority.  As part of our continuing mission to provide you with exceptional heart care, we have created designated Provider Care Teams.  These Care Teams include your primary Cardiologist (physician) and Advanced Practice Providers (APPs -  Physician Assistants and Nurse Practitioners) who all work together to provide you with the care you need, when you need it.    Your next appointment:   12 month(s)  The format for your next appointment:   In Person  Provider:   Bryan Lemmaavid Adriel Kessen, MD   Other Instructions n/a    Studies Ordered:   Orders Placed This Encounter  Procedures  . EKG 12-Lead     Bryan Lemmaavid Jennalynn Rivard, M.D., M.S. Interventional Cardiologist   Pager # [phone] Phone # [phone] [address] [place], [place] [address]   Thank you for choosing [facility] at [facility]!!

## 2019-12-09 NOTE — Patient Instructions (Signed)
Medication Instructions:  No changes  *If you need a refill on your cardiac medications before your next appointment, please call your pharmacy*   Lab Work: Please have your  primary send [place] a copy  Once it is completed. .    Testing/Procedures: Not needed   Follow-Up: At [facility], you and your health needs are our priority.  As part of our continuing mission to provide you with exceptional heart care, we have created designated Provider Care Teams.  These Care Teams include your primary Cardiologist (physician) and Advanced Practice Providers (APPs -  Physician Assistants and Nurse Practitioners) who all work together to provide you with the care you need, when you need it.    Your next appointment:   12 month(s)  The format for your next appointment:   In Person  Provider:   Bryan Lemma, MD   Other Instructions n/a

## 2019-12-14 ENCOUNTER — Encounter: Payer: Self-pay | Admitting: Cardiology

## 2019-12-14 NOTE — Assessment & Plan Note (Addendum)
Doing amazingly well.  No angina symptoms.  Fully exercising and doing well.  Nonischemic Myoview and normal wall motion on echo.  He is now walking 1.5 to 2 miles a day as well as other exertional exercises.  Feeling much better energy level better.  Plan: Continue current dose of beta-blocker and ARB along with HCTZ for well-controlled blood pressure  Continue lipid management for now as long as he is tolerating without any LFT abnormalities.  Remains on maintenance dose aspirin (which can be held for procedures or surgeries)  Last Myoview was in February 2018.  Unless symptoms warrant, would probably not be due until February 2022-we can discuss in follow-up.

## 2019-12-14 NOTE — Assessment & Plan Note (Addendum)
Blood pressure looks great on current medications.  I suspect that with weight loss we may need to eventually come off of his medications or reduced doses.  He is on high-dose losartan along with HCTZ and low-dose metoprolol.  No changes for now.

## 2019-12-14 NOTE — Assessment & Plan Note (Signed)
Mild non-STEMI in the setting of almost severe allergic reaction and heatstroke.  Found to have multivessel CAD and referred for CABG.  No sign of residual ischemia or infarct on Myoview.  No regional wall motion normalities on echo.

## 2019-12-14 NOTE — Assessment & Plan Note (Signed)
Remains on high-dose statin and high-dose fenofibrate.  Has not had labs checked since last July.  He is due to get labs checked within a month or so by his PCP.  He is actually supposed be going to see him relatively soon.  Need to closely monitor side effects from combination of fenofibrate and atorvastatin.  LDL has been pretty well controlled to date.

## 2019-12-14 NOTE — Assessment & Plan Note (Signed)
Tolerating CPAP.  No signs or symptoms of right heart failure/pulmonary hypertension

## 2019-12-14 NOTE — Assessment & Plan Note (Signed)
Pretty much stable on echo with a mean gradient of only 13 mmHg.  We will follow by exam.  Probably would not need to recheck an echocardiogram for least another 2 to 3 years.

## 2019-12-14 NOTE — Assessment & Plan Note (Signed)
No longer morbidly obese post GOP.  Simply in the obese spectrum, however is close to 70 pounds down from presurgical weight.  Tolerating his Diet well.  No issues.

## 2020-02-21 ENCOUNTER — Other Ambulatory Visit: Payer: Self-pay | Admitting: Cardiology

## 2020-12-04 ENCOUNTER — Other Ambulatory Visit: Payer: Self-pay | Admitting: Cardiology

## 2020-12-15 DIAGNOSIS — C44311 Basal cell carcinoma of skin of nose: Secondary | ICD-10-CM | POA: Insufficient documentation

## 2021-02-04 ENCOUNTER — Encounter: Payer: Self-pay | Admitting: Cardiology

## 2021-02-04 ENCOUNTER — Other Ambulatory Visit: Payer: Self-pay

## 2021-02-04 ENCOUNTER — Ambulatory Visit: Payer: Medicare Other | Admitting: Cardiology

## 2021-02-04 VITALS — BP 140/72 | HR 58 | Resp 18 | Ht 67.0 in | Wt 208.2 lb

## 2021-02-04 DIAGNOSIS — I350 Nonrheumatic aortic (valve) stenosis: Secondary | ICD-10-CM | POA: Diagnosis not present

## 2021-02-04 DIAGNOSIS — I214 Non-ST elevation (NSTEMI) myocardial infarction: Secondary | ICD-10-CM

## 2021-02-04 DIAGNOSIS — Z951 Presence of aortocoronary bypass graft: Secondary | ICD-10-CM

## 2021-02-04 DIAGNOSIS — I10 Essential (primary) hypertension: Secondary | ICD-10-CM | POA: Diagnosis not present

## 2021-02-04 DIAGNOSIS — E785 Hyperlipidemia, unspecified: Secondary | ICD-10-CM

## 2021-02-04 DIAGNOSIS — E669 Obesity, unspecified: Secondary | ICD-10-CM

## 2021-02-04 DIAGNOSIS — I25119 Atherosclerotic heart disease of native coronary artery with unspecified angina pectoris: Secondary | ICD-10-CM

## 2021-02-04 MED ORDER — BLOOD PRESSURE MONITOR DEVI: 0 refills | Status: AC

## 2021-02-04 NOTE — Progress Notes (Signed)
Primary Care Provider: Aquilla Hacker, MD Cardiologist: None Electrophysiologist: None  Clinic Note: Chief Complaint  Patient presents with   Follow-up    Doing well.  Exercising routinely.  Energy level great.  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  He is exercising routinely with no major issues.  He had a nonischemic Myoview in 2018.  Based on amount of exercise he is doing and lack of symptoms, I think we are okay holding off for follow-up stress test.  The plan had been to consider 1 in February 2022.  I think we can potentially consider follow-up Myoview in February 2023.  Plan: Continue aspirin and statin although we may reduce dose. Continue beta-blocker and ARB-May consider converting Lopressor to carvedilol for better blood pressure control depending on BP log Okay to hold aspirin if necessary for procedures.      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  Plan recheck Myoview in early January-February timeframe of 2023.       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  No signs of residual ischemia or infarct on Myoview.    He had his last Myoview in 2018.  Should be due for follow-up Myoview probably next next year.       Relevant Orders   EKG 12-Lead (Completed)   Obesity (BMI 30.0-34.9) (Chronic)    No longer  morbidly obese with a BMI now of 32.6 following gastric bypass surgery.  He is doing remarkably well lost over 80 pounds since his bypass surgery.  Energy level notably improved.  Exercising regularly.  I congratulated on his efforts.       Dyslipidemia, goal LDL below 70 (Chronic)    Lipids just checked in November are outstanding.  LDL 45.  The weight loss from his gastric bypass is certainly helped.  He is on 80 mg of atorvastatin but I think we can probably reduce it now the $Remov'40mg'zunoFc$  if his levels continue to be stable.       Essential hypertension (Chronic)    Borderline BP today.  I have asked that he keep a blood pressure log over the next month or 2.  We will then see him back in a couple months to reassess his pressures.  He is on metoprolol 25 mg twice daily and losartan 100 mg daily along with HCTZ.  With a heart rate of 58, cannot really increase metoprolol and the other 2 are at max dose.  Would probably consider converting from metoprolol to carvedilol if additional blood pressure reduction is required.         Relevant Medications   Blood Pressure Monitor DEVI   Other Relevant Orders   EKG 12-Lead (Completed)   Mild aortic valve stenosis (Chronic)    Minimal mean gradient as of 2021.  Recheck echocardiogram in  2024       ===================================  HPI:    David Hogan is a 75 y.o. male with a PMH notable for Multivessel CAD-CABG, Former Morbid Obesity s/p GOP (Gastric Sleeve-80 Pound Weight Loss), HTN, HLD who presents today for delayed annual follow-up.  David Hogan was last seen on 12/09/2019 as his first follow-up visit after bariatric surgery in December 2020.  He is doing well, exercising routinely and losing weight.  No adverse cardiac events.  Significant improvement in energy level.  Was walking almost 2 miles a day as well as doing gym exercises.  The only major issue he had was intermittent nephrolithiasis.  Still using CPAP, but was told that he  may not require it anymore after the weight loss.  He still was interested in wearing it until he was certain.  No medication changes made.  No further studies.  Recent Hospitalizations:  [facility] Emergency Room 10/08/2020 => he awoke with indigestion and bilateral arm tingling with some shortness of breath and diuresis but no nausea or vomiting.  Ruled out for MI.  Felt to be not anginal pain  Reviewed  CV studies:    The following studies were reviewed today: (if available, images/films reviewed: From Epic Chart or Care Everywhere) None:  Interval History:   David Hogan returns here today for annual follow-up doing quite well.  He is still using his CPAP and he is still exercising significantly.  He is says his energy level has really gone up.  He has lost over 80 pounds he says he feels 100% better.  He is walking and exercising regularly. -- He was happy to announce that as of his 1 year follow-up had lost 54 pounds.  Was told that he was doing outstandingly well.  They recommended resistance training 3 days a week along with twice a week typical cardio training.  He really denies having any episodes of chest tightness pressure with rest or exertion.  He had that one episode in March which he thinks was really GI in nature.  Probably related to his gastric bypass.  Has not anything like it since.  Otherwise totally stable from a cardiac standpoint.  His blood pressure of 140/72 is borderline, and he has not been checking his blood pressures lately.  He seems to think that this is a little bit high for him but he is not sure.23 is laying  CV Review of Symptoms (Summary) Cardiovascular ROS: no chest pain or dyspnea on exertion positive for - Heart palpitations that do not last very long.  Less than 10 to 15 seconds.  Usually associated with certain foods. negative for - edema, orthopnea, paroxysmal nocturnal dyspnea, shortness of breath, or lightheadedness or dizziness, syncope/near  syncope or TIA/amaurosis fugax.  REVIEWED OF SYSTEMS   Review of Systems  Constitutional:  Positive for weight loss (Definitely intentional). Negative for malaise/fatigue (Energy level notably improved).  HENT:  Positive for congestion (Chronic related to allergies.). Negative for nosebleeds.        Having skin cancer surgery on his nose  Respiratory:  Negative for shortness of breath (Only when he has bad congestion).   Cardiovascular:  Negative for leg swelling.  Gastrointestinal:  Negative for abdominal pain, blood in stool and melena.  Genitourinary:  Negative for frequency and hematuria.  Musculoskeletal:  Negative for back pain and joint pain.  Neurological:  Negative for dizziness, tingling and focal weakness.  Psychiatric/Behavioral: Negative.     I have reviewed and (  if needed) personally updated the patient's problem list, medications, allergies, past medical and surgical history, social and family history.   PAST MEDICAL HISTORY   Past Medical History:  Diagnosis Date   Asthma    CAD, multiple vessel not amenable to PCI 04/12/2013   Referred for CABG -- Most recent Myoview 09/2016 - No ischemia or infarct (but abnormal EKG)   Complication of anesthesia    "woke up wild in recovery after gallbladder OR" (04/08/2013)   Exertional shortness of breath    History of: Non-STEMI (non-ST elevated myocardial infarction) 04/08/2013   Down in the setting of syncope in reaction to heat exhaustion plus multiple bee stings   Hypertension    Mild aortic stenosis    Very Mild AS on Echo 09/2016   OSA on CPAP    S/P CABG x 3, LIMA-LAD; VG-Diag; 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 Otherwise mild diffuse disease in the circumflex and RCA just region.   CARPAL TUNNEL RELEASE  Left 1990's   CHOLECYSTECTOMY  ~ 2010   CORONARY ARTERY BYPASS GRAFT N/A 04/11/2013   Procedure: CORONARY ARTERY BYPASS GRAFTING (CABGx3: LIMA-LAD, SVG-RI, SVG-D1) ;  Surgeon: Grace Isaac, MD;  Location: [place];  Service: Open Heart Surgery;  Laterality: N/A;  x3 using right greater saphenous vein and left internal mammary.   Gastric bypass  07/09/2019   Gastric bypass sleeve surgery ([place]; Dr. Hoyle Barr)   [place]  ~ 2012   "from the gallbladder OR" (04/08/2013)   INCISION AND DRAINAGE OF WOUND  ~ 1996   "hit elbow at work; it swelled up; had it opened up to get pus out" (04/08/2013)   INTRAOPERATIVE TRANSESOPHAGEAL ECHOCARDIOGRAM N/A 04/11/2013   Procedure: INTRAOPERATIVE TRANSESOPHAGEAL ECHOCARDIOGRAM;  Surgeon: Grace Isaac, MD;  Location: [place];  Service: Open Heart Surgery;  Laterality: N/A;   LEFT HEART CATHETERIZATION WITH CORONARY ANGIOGRAM N/A 04/09/2013   Procedure: LEFT HEART CATHETERIZATION WITH CORONARY ANGIOGRAM;  Surgeon: Leonie Man, MD;  Location[facility];  Service: Cardiovascular;  Laterality: N/A;   NM MYOVIEW LTD  09/2016   Normal EF 61%. Horizontal ST segment depression noted in inferior and lateral leads with no evidence of ischemia or infarction on imaging. LOW RISK.   SHOULDER OPEN ROTATOR CUFF REPAIR Bilateral 1990's-2000's   TRANSTHORACIC ECHOCARDIOGRAM  09/'14; 2/'18:   a) EF 65-70%. Normal wall motion. Grade 1 diastolic dysfunction. Aortic sclerosis but no stenosis;; b) Normal LVEF 60-65%. Normal diastolic function. (Poor acoustic windows). Very Mild AS   TRANSTHORACIC ECHOCARDIOGRAM  05/2019   Echocardiogram: EF 60 to 65%.  Moderate posterior wall thickness but no evidence of LVH.  GR 1 DD.  Normal RV size and function.  Normal atrial size.  Normal mitral valve.  Moderate thickening and calcification of the noncoronary cusp of the aortic valve => MILD AORTIC STENOSIS (mean gradient 13.5 mmHg.  (Stable from 2018).     There is no immunization history on file for this patient.  MEDICATIONS/ALLERGIES   Current Meds  Medication Sig   aprepitant (EMEND) 40 MG capsule Take 40 mg by mouth every morning.   aspirin EC 81 MG tablet Take 1 tablet (81 mg total) by mouth daily.   atorvastatin (LIPITOR) 80 MG tablet Take 1 tablet (80 mg total)  by mouth daily. Please keep upcoimg appointment   benzonatate (TESSALON) 200 MG capsule Take 200 mg by mouth once as needed for cough.   Blood Pressure Monitor DEVI Prescription for blood cuff automatic -- Monitor blood pressure 3 - 4 times a week and record   cyanocobalamin 100 MCG tablet Take by mouth.   EPIPEN 2-PAK 0.3 MG/0.3ML SOAJ injection    fenofibrate 160 MG tablet Take 1 tablet (160 mg total) by mouth daily.   HONEY BEE VENOM IJ Inject as directed.   hydrochlorothiazide (HYDRODIURIL) 25 MG tablet TAKE ONE TABLET BY MOUTH EVERY DAY   ibuprofen (ADVIL) 800 MG tablet Take by mouth.   levofloxacin (LEVAQUIN) 500 MG tablet Take 500 mg by mouth at bedtime.   losartan (COZAAR) 100 MG tablet Take 100 mg by mouth daily.   metoprolol tartrate (LOPRESSOR) 25 MG tablet TAKE ONE TABLET BY MOUTH 2 TIMES A DAY   montelukast (SINGULAIR) 10 MG tablet Take 10 mg by mouth every morning.    nitroGLYCERIN (NITROSTAT) 0.4 MG SL tablet PLACE 1 TABLET UNDER THE TONGUE EVERY 5 MINUTES AS NEEDED FOR CHEST PAIN   Spacer/Aero-Holding Chambers (AEROCHAMBER PLUS FLO-VU) MISC Take 2 puffs by mouth as needed (SOB).    Vitamin D3 (VITAMIN D) 25 MCG tablet Take one tablet (2,000 Units dose) by mouth 2 (two) times daily.    Allergies  Allergen Reactions   Bee Venom Other (See Comments)    Yellow jackets    SOCIAL HISTORY/FAMILY HISTORY   Reviewed in Epic:  Pertinent findings:  Social History   Tobacco Use   Smoking status: Never   Smokeless tobacco: Never  Substance Use Topics   Alcohol use: Yes    Comment: 04/09/2013 "last drink was in 1978 or so; never had a problem w/it"    Drug use: No   Social History   Social History Narrative   Married for 32 years. No living children. The 2 grandchildren.    He is a retired Corporate treasurer from [employer]   He never smoked. Does not drink alcohol.   He currently walks 2-3 times a day since discharge.   He now wears a baseball hat with several bees on it that says "who knew"  Family history notable for heart disease/heart attack in his father and brother.  Mother had a stroke with hypertension.  OBJCTIVE -PE, EKG, labs   Wt Readings from Last 3 Encounters:  02/04/21 208 lb 3.2 oz (94.4 kg)  12/09/19 216 lb 6.4 oz (98.2 kg)  05/22/19 270 lb (122.5 kg)    Physical Exam: BP 140/72 (BP Location: Left Arm, Patient Position: Sitting, Cuff Size: Normal)   Pulse (!) 58   Resp 18   Ht $R'5\' 7"'lY$  (1.702 m)   Wt 208 lb 3.2 oz (94.4 kg)   SpO2 97%   BMI 32.61 kg/m  Physical Exam Constitutional:      General: He is not in acute distress.    Appearance: Normal appearance. He is not ill-appearing or toxic-appearing.     Comments: Now only mildly obese  HENT:     Head: Normocephalic and atraumatic.     Comments: He always has a somewhat sniffling type of breathing from his chronic congestion. Neck:     Vascular: No carotid bruit or JVD.  Cardiovascular:     Rate and Rhythm: Normal rate and regular rhythm. No extrasystoles are present.    Chest Wall: PMI is not displaced.     Pulses: Normal  pulses.     Heart sounds: S1 normal and S2 normal. Heart sounds are distant. Murmur (1/6 SEM at RUSB.) heard.    No friction rub. No gallop.  Pulmonary:     Effort: Pulmonary effort is normal. No respiratory distress.     Breath sounds: Normal breath sounds.  Chest:     Chest wall: No tenderness.  Musculoskeletal:        General: No swelling. Normal range of motion.     Cervical back: Normal range of motion and neck supple.  Skin:    General: Skin is warm and dry.  Neurological:     General: No focal deficit  present.     Mental Status: He is alert and oriented to person, place, and time.  Psychiatric:        Mood and Affect: Mood normal.        Behavior: Behavior normal.        Thought Content: Thought content normal.        Judgment: Judgment normal.     Adult ECG Report  Rate: 58 ;  Rhythm: sinus bradycardia and normal axis, intervals and durations. ;   Narrative Interpretation: Stable  Recent Labs:  reviewed  [place] Related to Lipid Panel Component 06/28/20  10/07/19   Cholesterol, Total 99 Low  94 Low   Triglycerides 60 119  HDL 40 29 Low   VLDL Cholesterol Cal 14 22  LDL 45 43   CBC And Differential Component 10/08/20  06/28/20   WBC 6.5 5.3  RBC 4.55 4.44  HGB 14.2 --  HCT 40.8 --  MCV 90 91  MCH 31.2 31.1  MCHC 34.8 34.2  Plt Ct 142 Low  --   Comprehensive Metabolic Panel Component 66/59/93  10/08/20  06/28/20   Na -- 141 --  Potassium 3.7 3.7 4.1  Cl -- 104 --  CO2 $Re'22 27 24  'yaB$ AGAP -- 10 --  Glucose 77 94 87  BUN $Re'14 16 15  'KSb$ Creatinine 0.97 0.87 0.96  Ca -- 9.7 --  ALK PHOS -- 91 --  T Bili -- 1.31 High  --  Total Protein 6.7 7.0 6.4  Alb -- 4.7 --  GLOBULIN -- 2.3 --  ALBUMIN/GLOBULIN RATIO -- 2.0 --  BUN/CREAT RATIO -- 18.4 --  ALT -- 37 --  AST 34 34 57 High     No results found for: TSH  ==================================================  COVID-19 Education: The signs and symptoms of COVID-19 were discussed with the patient and how to seek care for testing (follow up with PCP or arrange E-visit).    I spent a total of 18 minutes with the patient spent in direct patient consultation.  Additional time spent with chart review  / charting (studies, outside notes, etc): 14 min Total Time: 32 min  Current medicines are reviewed at length with the patient today.  (+/- concerns) none  This visit occurred during the SARS-CoV-2 public health emergency.  Safety protocols were in place, including screening questions prior to the visit, additional  usage of staff PPE, and extensive cleaning of exam room while observing appropriate contact time as indicated for disinfecting solutions.  Notice: This dictation was prepared with Dragon dictation along with smaller phrase technology. Any transcriptional errors that result from this process are unintentional and may not be corrected upon review.  Patient Instructions / Medication Changes & Studies & Tests Ordered   Patient Instructions  Medication Instructions:  No changes  *If you need a refill  on your cardiac medications before your next appointment, please call your pharmacy*   Lab Work: Not needed    Testing/Procedures: Not needed   Follow-Up: At [facility], you and your health needs are our priority.  As part of our continuing mission to provide you with exceptional heart care, we have created designated Provider Care Teams.  These Care Teams include your primary Cardiologist (physician) and Advanced Practice Providers (APPs -  Physician Assistants and Nurse Practitioners) who all work together to provide you with the care you need, when you need it.  We recommend signing up for the patient portal called "MyChart".  Sign up information is provided on this After Visit Summary.  MyChart is used to connect with patients for Virtual Visits (Telemedicine).  Patients are able to view lab/test results, encounter notes, upcoming appointments, etc.  Non-urgent messages can be sent to your provider as well.   To learn more about what you can do with MyChart, go to [email or web address].    Your next appointment:   3 month(s)  The format for your next appointment:   Virtual Visit   Provider:   Glenetta Hew, MD   Other Instructions     Studies Ordered:   Orders Placed This Encounter  Procedures   EKG 12-Lead     Glenetta Hew, M.D., M.S. Interventional Cardiologist   Pager # [phone] Phone # [phone] [address] [place], [place]  [address]   Thank you for choosing Heartcare at [facility]!!

## 2021-02-04 NOTE — Patient Instructions (Addendum)
Medication Instructions:  No changes  *If you need a refill on your cardiac medications before your next appointment, please call your pharmacy*   Lab Work: Not needed    Testing/Procedures: Not needed   Follow-Up: At [facility], you and your health needs are our priority.  As part of our continuing mission to provide you with exceptional heart care, we have created designated Provider Care Teams.  These Care Teams include your primary Cardiologist (physician) and Advanced Practice Providers (APPs -  Physician Assistants and Nurse Practitioners) who all work together to provide you with the care you need, when you need it.  We recommend signing up for the patient portal called "MyChart".  Sign up information is provided on this After Visit Summary.  MyChart is used to connect with patients for Virtual Visits (Telemedicine).  Patients are able to view lab/test results, encounter notes, upcoming appointments, etc.  Non-urgent messages can be sent to your provider as well.   To learn more about what you can do with MyChart, go to [email or web address].    Your next appointment:   3 month(s)  The format for your next appointment:   Virtual Visit   Provider:   Bryan Lemma, MD   Other Instructions

## 2021-02-19 ENCOUNTER — Other Ambulatory Visit: Payer: Self-pay | Admitting: Cardiology

## 2021-02-19 ENCOUNTER — Encounter: Payer: Self-pay | Admitting: Cardiology

## 2021-02-19 NOTE — Assessment & Plan Note (Signed)
Would have been due for 4-year follow-up Myoview in February of this year, however with him being so asymptomatic, Nadean Corwin put off another year.  Plan recheck Myoview in early January-February timeframe of 2023.

## 2021-02-19 NOTE — Assessment & Plan Note (Signed)
He only had a mild non-STEMI in the setting of a allergic reaction with demand ischemia essentially found to have multivessel disease and separate CABG.  No signs of residual ischemia or infarct on Myoview.    He had his last Myoview in 2018.  Should be due for follow-up Myoview probably next next year.

## 2021-02-19 NOTE — Assessment & Plan Note (Signed)
Doing very well post CABG.  He is exercising routinely with no major issues.  He had a nonischemic Myoview in 2018.  Based on amount of exercise he is doing and lack of symptoms, I think we are okay holding off for follow-up stress test.  The plan had been to consider 1 in February 2022.  I think we can potentially consider follow-up Myoview in February 2023.  Plan:  Continue aspirin and statin although we may reduce dose.  Continue beta-blocker and ARB-May consider converting Lopressor to carvedilol for better blood pressure control depending on BP log  Okay to hold aspirin if necessary for procedures.

## 2021-02-19 NOTE — Assessment & Plan Note (Signed)
Lipids just checked in November are outstanding.  LDL 45.  The weight loss from his gastric bypass is certainly helped.  He is on 80 mg of atorvastatin but I think we can probably reduce it now the 40mg  if his levels continue to be stable.

## 2021-02-19 NOTE — Assessment & Plan Note (Signed)
Minimal mean gradient as of 2021.  Recheck echocardiogram in 2024

## 2021-02-19 NOTE — Assessment & Plan Note (Signed)
Borderline BP today.  I have asked that he keep a blood pressure log over the next month or 2.  We will then see him back in a couple months to reassess his pressures.  He is on metoprolol 25 mg twice daily and losartan 100 mg daily along with HCTZ.  With a heart rate of 58, cannot really increase metoprolol and the other 2 are at max dose.  Would probably consider converting from metoprolol to carvedilol if additional blood pressure reduction is required.

## 2021-02-19 NOTE — Assessment & Plan Note (Signed)
No longer morbidly obese with a BMI now of 32.6 following gastric bypass surgery.  He is doing remarkably well lost over 80 pounds since his bypass surgery.  Energy level notably improved.  Exercising regularly.  I congratulated on his efforts.

## 2021-05-11 ENCOUNTER — Telehealth: Payer: Self-pay | Admitting: *Deleted

## 2021-05-11 ENCOUNTER — Encounter: Payer: Self-pay | Admitting: Cardiology

## 2021-05-11 ENCOUNTER — Telehealth (INDEPENDENT_AMBULATORY_CARE_PROVIDER_SITE_OTHER): Payer: Medicare Other | Admitting: Cardiology

## 2021-05-11 VITALS — BP 128/64 | HR 66 | Ht 67.0 in | Wt 203.0 lb

## 2021-05-11 DIAGNOSIS — E785 Hyperlipidemia, unspecified: Secondary | ICD-10-CM | POA: Diagnosis not present

## 2021-05-11 DIAGNOSIS — I350 Nonrheumatic aortic (valve) stenosis: Secondary | ICD-10-CM

## 2021-05-11 DIAGNOSIS — I10 Essential (primary) hypertension: Secondary | ICD-10-CM | POA: Diagnosis not present

## 2021-05-11 DIAGNOSIS — I25119 Atherosclerotic heart disease of native coronary artery with unspecified angina pectoris: Secondary | ICD-10-CM

## 2021-05-11 NOTE — Telephone Encounter (Signed)
RN spoke to patient. Instruction were given  from today's virtual visit 05/11/21 .  AVS SUMMARY has been sent by mychart .   Patient verbalized understanding. 

## 2021-05-11 NOTE — Progress Notes (Signed)
Virtual Visit via Telephone Note   This visit type was conducted due to national recommendations for restrictions regarding the COVID-19 Pandemic (e.g. social distancing) in an effort to limit this patient's exposure and mitigate transmission in our community.  Due to his co-morbid illnesses, this patient is at least at moderate risk for complications without adequate follow up.  This format is felt to be most appropriate for this patient at this time.  The patient did not have access to video technology/had technical difficulties with video requiring transitioning to audio format only (telephone).  All issues noted in this document were discussed and addressed.  No physical exam could be performed with this format.  Please refer to the patient's chart for his  consent to telehealth for [facility].   Patient has given verbal permission to conduct this visit via virtual appointment and to bill insurance 06/05/2021 7:46 PM     Evaluation Performed:  Follow-up visit  Date:  06/05/2021   ID:  David Hogan, David Hogan Apr 07, 1946, MRN [id number]  Patient Location: Home Provider Location: Office/Clinic  PCP:  Aquilla Hacker, MD  Cardiologist:  None Glenetta Hew, MD  Electrophysiologist:  None   Chief Complaint:   Chief Complaint  Patient presents with   Follow-up    Blood pressure follow-up   Coronary Artery Disease    No angina   Hypertension    Blood pressure remains acceptable.     ====================================  ASSESSMENT & PLAN:    Problem List Items Addressed This Visit       Cardiology Problems   CAD, multiple vessel not amenable to PCI at cath 04/09/13 --> CABG (Chronic)    Doing well post CABG.  No recurrent anginal pain.  Last Myoview was in 2018.  Would probably do to check Myoview in 2023.  Continue stable dose of aspirin, statin, beta-blocker and ARB.  Okay to hold aspirin as needed for procedures.  (5 days.)      Dyslipidemia, goal LDL below 70  (Chronic)    He is due to have labs checked by PCP.  Last check showed excellent control.  For now continue current dose of atorvastatin, but could potentially reduce if levels continue to be as excellent.      Essential hypertension - Primary (Chronic)    Blood pressure levels are much better now based on his home recordings.  For now we will continue on current medications.  Low threshold to potentially add diuretic versus switch from metoprolol to carvedilol.      Mild aortic valve stenosis (Chronic)    Plan.  Repeat echocardiogram in 2 years.       ====================================  History of Present Illness:    David Hogan is a 75 y.o. male with PMH notable for MV CAD-CABG, previously Morbidly Obese - s/p Bariatric Sgx (Gastric Sleeve w/ ~80 lb wgt loss), HTN, & HLD who presents via audio/telephone conferencing for a telehealth visit today as a 33-monthfollow-up to reassess blood pressure and heart rate..David Hallswas last seen February 04, 2021   Hospitalizations:  none  Recent - Interim CV studies:   The following studies were reviewed today: none:  Inerval History   [name]  Still working hard on losing weight.  Has lost another 5 pounds and is trying to maintain TAVR between 200 to 205 pounds.  His heart rates are ranging anywhere from 60- 80 bpm with blood pressures ranging from  the 120s-140s/70s.  Still exercises routinely.  Feels 100s and better with weight loss.  He is doing at least 3 days a week with cardio training.  No chest pain or pressure with rest or exertion.  No CHF symptoms.  No palpitations.  Cardiovascular ROS: no chest pain or dyspnea on exertion positive for - intentional wgt loss negative for - edema, irregular heartbeat, orthopnea, palpitations, paroxysmal nocturnal dyspnea, rapid heart rate, shortness of breath, or syncope/near syncope, TIA/amaurosis fugax; claudication; & minimal seasonal  allergies/asthma   ROS:  Please see the history of present illness.   Pertinent positives and negatives.  Otherwise negative.  Intended weight loss. Chronic congestion  Past Medical History:  Diagnosis Date   Asthma    CAD, multiple vessel not amenable to PCI 04/12/2013   Referred for CABG -- Most recent Myoview 09/2016 - No ischemia or infarct (but abnormal EKG)   Complication of anesthesia    "woke up wild in recovery after gallbladder OR" (04/08/2013)   Exertional shortness of breath    History of: Non-STEMI (non-ST elevated myocardial infarction) 04/08/2013   Down in the setting of syncope in reaction to heat exhaustion plus multiple bee stings   Hypertension    Mild aortic stenosis    Very Mild AS on Echo 09/2016   OSA on CPAP    S/P CABG x 3, LIMA-LAD; VG-Diag; VG-ramus intermediate 04/11/13 04/11/2013   Past Surgical History:  Procedure Laterality Date   CARDIAC CATHETERIZATION  04/09/2013   Ostial and proximal LAD involving D1 80-90% stenoses, ramus intermedius proximal to ostial 90%. Otherwise mild diffuse disease in the circumflex and RCA just region.   CARPAL TUNNEL RELEASE Left 1990's   CHOLECYSTECTOMY  ~ 2010   CORONARY ARTERY BYPASS GRAFT N/A 04/11/2013   Procedure: CORONARY ARTERY BYPASS GRAFTING (CABGx3: LIMA-LAD, SVG-RI, SVG-D1) ;  Surgeon: Grace Isaac, MD;  Location: [place];  Service: Open Heart Surgery;  Laterality: N/A;  x3 using right greater saphenous vein and left internal mammary.   Gastric bypass  07/09/2019   Gastric bypass sleeve surgery ([place]; Dr. Hoyle Barr)   [place]  ~ 2012   "from the gallbladder OR" (04/08/2013)   INCISION AND DRAINAGE OF WOUND  ~ 1996   "hit elbow at work; it swelled up; had it opened up to get pus out" (04/08/2013)   INTRAOPERATIVE TRANSESOPHAGEAL ECHOCARDIOGRAM N/A 04/11/2013   Procedure: INTRAOPERATIVE TRANSESOPHAGEAL ECHOCARDIOGRAM;  Surgeon: Grace Isaac, MD;  Location: [place];  Service: Open Heart  Surgery;  Laterality: N/A;   LEFT HEART CATHETERIZATION WITH CORONARY ANGIOGRAM N/A 04/09/2013   Procedure: LEFT HEART CATHETERIZATION WITH CORONARY ANGIOGRAM;  Surgeon: Leonie Man, MD;  Location: [facility] CATH LAB;  Service: Cardiovascular;  Laterality: N/A;   NM MYOVIEW LTD  09/2016   Normal EF 61%. Horizontal ST segment depression noted in inferior and lateral leads with no evidence of ischemia or infarction on imaging. LOW RISK.   SHOULDER OPEN ROTATOR CUFF REPAIR Bilateral 1990's-2000's   TRANSTHORACIC ECHOCARDIOGRAM  09/'14; 2/'18:   a) EF 65-70%. Normal wall motion. Grade 1 diastolic dysfunction. Aortic sclerosis but no stenosis;; b) Normal LVEF 60-65%. Normal diastolic function. (Poor acoustic windows). Very Mild AS   TRANSTHORACIC ECHOCARDIOGRAM  05/2019   Echocardiogram: EF 60 to 65%.  Moderate posterior wall thickness but no evidence of LVH.  GR 1 DD.  Normal RV size and function.  Normal atrial size.  Normal mitral valve.  Moderate thickening and calcification of the noncoronary  cusp of the aortic valve => MILD AORTIC STENOSIS (mean gradient 13.5 mmHg.  (Stable from 2018).     Current Meds  Medication Sig   aprepitant (EMEND) 40 MG capsule Take 40 mg by mouth every morning.   aspirin EC 81 MG tablet Take 1 tablet (81 mg total) by mouth daily.   atorvastatin (LIPITOR) 80 MG tablet Take 1 tablet (80 mg total) by mouth daily. Please keep upcoimg appointment   benzonatate (TESSALON) 200 MG capsule Take 200 mg by mouth once as needed for cough.   Blood Pressure Monitor DEVI Prescription for blood cuff automatic -- Monitor blood pressure 3 - 4 times a week and record   cyanocobalamin 100 MCG tablet Take by mouth.   EPIPEN 2-PAK 0.3 MG/0.3ML SOAJ injection    fenofibrate 160 MG tablet Take 1 tablet (160 mg total) by mouth daily.   HONEY BEE VENOM IJ Every 2 months   hydrochlorothiazide (HYDRODIURIL) 25 MG tablet TAKE ONE TABLET BY MOUTH EVERY DAY   ibuprofen (ADVIL) 800 MG tablet Take by  mouth.   levofloxacin (LEVAQUIN) 500 MG tablet Take 500 mg by mouth at bedtime.   losartan (COZAAR) 100 MG tablet Take 100 mg by mouth daily.   metoprolol tartrate (LOPRESSOR) 25 MG tablet TAKE ONE TABLET BY MOUTH 2 TIMES A DAY   montelukast (SINGULAIR) 10 MG tablet Take 10 mg by mouth every morning.    nitroGLYCERIN (NITROSTAT) 0.4 MG SL tablet PLACE 1 TABLET UNDER THE TONGUE EVERY 5 MINUTES AS NEEDED FOR CHEST PAIN   Spacer/Aero-Holding Chambers (AEROCHAMBER PLUS FLO-VU) MISC Take 2 puffs by mouth as needed (SOB).    Vitamin D3 (VITAMIN D) 25 MCG tablet Take one tablet (2,000 Units dose) by mouth 2 (two) times daily.     Allergies:   Bee venom   Social History   Tobacco Use   Smoking status: Never   Smokeless tobacco: Never  Substance Use Topics   Alcohol use: Yes    Comment: 04/09/2013 "last drink was in 1978 or so; never had a problem w/it"   Drug use: No     Family Hx: The patient's family history includes Heart attack in his brother and mother; Heart disease in his father; Stroke (age of onset: 50) in his mother.   Labs/Other Tests and Data Reviewed:    EKG:  No ECG reviewed.  Recent Labs: No results found for requested labs within last 8760 hours.   Recent Lipid Panel [facility] Related to Lipid Panel Component 06/28/20  10/07/19   Cholesterol, Total 99 Low  94 Low   Triglycerides 60 119  HDL 40 29 Low   VLDL Cholesterol Cal 14 22  LDL 45 43  Comprehensive Metabolic Panel Component 54/62/70  10/08/20  06/28/20   Na -- 141 --  Potassium 3.7 3.7 4.1  Cl -- 104 --  CO2 _0 AGAP -- 10 --  Glucose 77 94 87  BUN _1 Creatinine 0.97 0.87 0.96  Ca -- 9.7 --  ALK PHOS -- 91 --  T Bili -- 1.31 High  --  Total Protein 6.7 7.0 6.4  Alb -- 4.7 --  GLOBULIN -- 2.3 --  ALBUMIN/GLOBULIN RATIO -- 2.0 --  BUN/CREAT RATIO -- 18.4 --  ALT -- 37 --  AST 34 34 57 High     Wt Readings from Last 3 Encounters:  05/11/21 203 lb (92.1 kg)  02/04/21 208 lb  3.2 oz (94.4 kg)  12/09/19  216 lb 6.4 oz (98.2 kg)     Objective:    Vital Signs:  BP 128/64 (BP Location: Left Arm)   Pulse 66   Ht _0  (1.702 m)   Wt 203 lb (92.1 kg)   BMI 31.79 kg/m   VITAL SIGNS:  reviewed GEN:  no acute distress RESPIRATORY:  normal respiratory effort, symmetric expansion NEURO:  alert and oriented x 3, no obvious focal deficit PSYCH:  normal affect   ==========================================  COVID-19 Education: The signs and symptoms of COVID-19 were discussed with the patient and how to seek care for testing (follow up with PCP or arrange E-visit).   The importance of social distancing was discussed today.  Time:   Today, I have spent 8 minutes with the patient with telehealth technology discussing the above problems.   An additional 25mnutes spent charting (reviewing prior notes, hospital records, studies, labs etc.) Total 157mutes   Medication Adjustments/Labs and Tests Ordered: Current medicines are reviewed at length with the patient today.  Concerns regarding medicines are outlined above.   Patient Instructions  Medication Instructions:  None *If you need a refill on your cardiac medications before your next appointment, please call your pharmacy*   Lab Work: Asked that PCP sends copy of labs to be can review   Testing/Procedures: None   Follow-Up: [facility].  As part of our continuing mission to provide you with exceptional heart care, we have created designated Provider Care Teams.  These Care Teams include your primary Cardiologist (physician) and Advanced Practice Providers (APPs -  Physician Assistants and Nurse Practitioners) who all work together to provide you with the care you need, when you need it.  We recommend signing up for the patient portal called "MyChart".  Sign up information is provided on this After Visit Summary.  MyChart is used to connect with patients for  Virtual Visits (Telemedicine).  Patients are able to view lab/test results, encounter notes, upcoming appointments, etc.  Non-urgent messages can be sent to your provider as well.   To learn more about what you can do with MyChart, go to [email or web address]   Your next appointment:   9 month(s) ( July 2023)   The format for your next appointment:   In Person  Provider:   DaGlenetta HewMD   Other Instructions Keep staying active and exercising.  Keep the weight down.  Let usKoreanow if there is any issues.   Signed, DaGlenetta HewMD  06/05/2021 7:46 PM    [place]

## 2021-05-11 NOTE — Telephone Encounter (Signed)
  Patient Consent for Virtual Visit        [name] has provided verbal consent on 05/11/2021 for a virtual visit (video or telephone).   CONSENT FOR VIRTUAL VISIT FOR:  David Hogan  By participating in this virtual visit I agree to the following:  I hereby voluntarily request, consent and authorize [facility] and its employed or contracted physicians, physician assistants, nurse practitioners or other licensed health care professionals (the Practitioner), to provide me with telemedicine health care services (the "Services") as deemed necessary by the treating Practitioner. I acknowledge and consent to receive the Services by the Practitioner via telemedicine. I understand that the telemedicine visit will involve communicating with the Practitioner through live audiovisual communication technology and the disclosure of certain medical information by electronic transmission. I acknowledge that I have been given the opportunity to request an in-person assessment or other available alternative prior to the telemedicine visit and am voluntarily participating in the telemedicine visit.  I understand that I have the right to withhold or withdraw my consent to the use of telemedicine in the course of my care at any time, without affecting my right to future care or treatment, and that the Practitioner or I may terminate the telemedicine visit at any time. I understand that I have the right to inspect all information obtained and/or recorded in the course of the telemedicine visit and may receive copies of available information for a reasonable fee.  I understand that some of the potential risks of receiving the Services via telemedicine include:  Delay or interruption in medical evaluation due to technological equipment failure or disruption; Information transmitted may not be sufficient (e.g. poor resolution of images) to allow for appropriate medical decision making by the Practitioner;  and/or  In rare instances, security protocols could fail, causing a breach of personal health information.  Furthermore, I acknowledge that it is my responsibility to provide information about my medical history, conditions and care that is complete and accurate to the best of my ability. I acknowledge that Practitioner's advice, recommendations, and/or decision may be based on factors not within their control, such as incomplete or inaccurate data provided by me or distortions of diagnostic images or specimens that may result from electronic transmissions. I understand that the practice of medicine is not an exact science and that Practitioner makes no warranties or guarantees regarding treatment outcomes. I acknowledge that a copy of this consent can be made available to me via my patient portal [facility] MyChart), or I can request a printed copy by calling the office of [facility].    I understand that my insurance will be billed for this visit.   I have read or had this consent read to me. I understand the contents of this consent, which adequately explains the benefits and risks of the Services being provided via telemedicine.  I have been provided ample opportunity to ask questions regarding this consent and the Services and have had my questions answered to my satisfaction. I give my informed consent for the services to be provided through the use of telemedicine in my medical care

## 2021-05-11 NOTE — Patient Instructions (Addendum)
Medication Instructions:  None *If you need a refill on your cardiac medications before your next appointment, please call your pharmacy*   Lab Work: Asked that PCP sends copy of labs to be can review   Testing/Procedures: None   Follow-Up: At [facility], you and your health needs are our priority.  As part of our continuing mission to provide you with exceptional heart care, we have created designated Provider Care Teams.  These Care Teams include your primary Cardiologist (physician) and Advanced Practice Providers (APPs -  Physician Assistants and Nurse Practitioners) who all work together to provide you with the care you need, when you need it.  We recommend signing up for the patient portal called "MyChart".  Sign up information is provided on this After Visit Summary.  MyChart is used to connect with patients for Virtual Visits (Telemedicine).  Patients are able to view lab/test results, encounter notes, upcoming appointments, etc.  Non-urgent messages can be sent to your provider as well.   To learn more about what you can do with MyChart, go to [email or web address].    Your next appointment:   9 month(s) ( July 2023)   The format for your next appointment:   In Person  Provider:   Bryan Lemma, MD   Other Instructions Keep staying active and exercising.  Keep the weight down.  Let us know if there is any issues.

## 2021-06-05 ENCOUNTER — Encounter: Payer: Self-pay | Admitting: Cardiology

## 2021-06-05 NOTE — Assessment & Plan Note (Signed)
Blood pressure levels are much better now based on his home recordings.  For now we will continue on current medications.  Low threshold to potentially add diuretic versus switch from metoprolol to carvedilol.

## 2021-06-05 NOTE — Assessment & Plan Note (Signed)
Doing well post CABG.  No recurrent anginal pain.  Last Myoview was in 2018.  Would probably do to check Myoview in 2023.  Continue stable dose of aspirin, statin, beta-blocker and ARB.  Okay to hold aspirin as needed for procedures.  (5 days.)

## 2021-06-05 NOTE — Assessment & Plan Note (Signed)
He is due to have labs checked by PCP.  Last check showed excellent control.  For now continue current dose of atorvastatin, but could potentially reduce if levels continue to be as excellent.

## 2021-06-05 NOTE — Assessment & Plan Note (Signed)
Plan.  Repeat echocardiogram in 2 years.

## 2021-07-23 ENCOUNTER — Other Ambulatory Visit: Payer: Self-pay | Admitting: Cardiology

## 2021-09-20 DIAGNOSIS — G5601 Carpal tunnel syndrome, right upper limb: Secondary | ICD-10-CM | POA: Insufficient documentation

## 2021-09-25 ENCOUNTER — Other Ambulatory Visit: Payer: Self-pay | Admitting: Cardiology

## 2022-03-26 ENCOUNTER — Other Ambulatory Visit: Payer: Self-pay | Admitting: Cardiology

## 2022-07-17 ENCOUNTER — Other Ambulatory Visit: Payer: Self-pay | Admitting: Cardiology

## 2022-08-07 HISTORY — PX: NM MYOVIEW LTD: HXRAD82

## 2022-08-18 ENCOUNTER — Encounter: Payer: Self-pay | Admitting: Cardiology

## 2022-08-18 ENCOUNTER — Ambulatory Visit: Payer: Medicare Other | Attending: Cardiology | Admitting: Cardiology

## 2022-08-18 VITALS — BP 142/78 | HR 81 | Ht 68.0 in | Wt 223.6 lb

## 2022-08-18 DIAGNOSIS — R0609 Other forms of dyspnea: Secondary | ICD-10-CM

## 2022-08-18 DIAGNOSIS — I10 Essential (primary) hypertension: Secondary | ICD-10-CM | POA: Diagnosis not present

## 2022-08-18 DIAGNOSIS — I350 Nonrheumatic aortic (valve) stenosis: Secondary | ICD-10-CM

## 2022-08-18 DIAGNOSIS — I214 Non-ST elevation (NSTEMI) myocardial infarction: Secondary | ICD-10-CM

## 2022-08-18 DIAGNOSIS — Z951 Presence of aortocoronary bypass graft: Secondary | ICD-10-CM | POA: Diagnosis not present

## 2022-08-18 DIAGNOSIS — I25119 Atherosclerotic heart disease of native coronary artery with unspecified angina pectoris: Secondary | ICD-10-CM | POA: Diagnosis not present

## 2022-08-18 DIAGNOSIS — G4733 Obstructive sleep apnea (adult) (pediatric): Secondary | ICD-10-CM

## 2022-08-18 DIAGNOSIS — E785 Hyperlipidemia, unspecified: Secondary | ICD-10-CM

## 2022-08-18 NOTE — Patient Instructions (Signed)
Medication Instructions:  No changes   *If you need a refill on your cardiac medications before your next appointment, please call your pharmacy*   Lab Work: Not needed    Testing/Procedures:  Both Will be schedule at [place] has requested that you have an echocardiogram. Echocardiography is a painless test that uses sound waves to create images of your heart. It provides your doctor with information about the size and shape of your heart and how well your heart's chambers and valves are working. This procedure takes approximately one hour. There are no restrictions for this procedure. Please do NOT wear cologne, perfume, aftershave, or lotions (deodorant is allowed). Please arrive 15 minutes prior to your appointment time.  and  Your doctor has scheduled you for a Stress Myocardial Perfusion scan is obtain information about the blood flow to your heart. The test consists of taking pictures of your heart in two phases: while resting and after a stress test.  The stress test may involve walking on a treadmill, or if you are unable to exercise adequately, you will be given a drug intended to have a similar effect on the heart to that of exercise.  The test will take approximately 3 to 4  hours to complete.  If you are pregnant or breastfeeding,  please notify the staff prior to your test.  How to prepare for your test: Do not eat or drink 2 hours prior to your test Do not consume products containing caffeine 12 hours prior to your test (examples: coffee (regular OR decaf), chocolate, sodas, tea) Your doctor may need you to hold certain medications prior to the test.  If so, these are listed below and should not be taken for 24 hours prior to the test.  If not listed below, you may take your medications as normal.  You may resume taking held medications on your normal schedule once the test is complete.   Meds to hold: none Do bring a list of your  current medications with you.  If you have held any meds in preparation for the test, please bring them, as you may be required to take them once the test is completed. Do wear comfortable clothes and walking shoes.  Do not wear dresses or overalls. Do NOT wear cologne, perfume, aftershave, or fragranced lotions the day of your test (deodorants okay). If these instructions are not followed your test will have to be rescheduled.   A nuclear cardiologist will review your test, prepare a report and send it to your physician.   If you have questions or concerns about your appointment, you can call the Nuclear Cardiology department at [phone]. If you cannot keep your appointment, please provide 48 hours notification to avoid a possible $50.00 charge to your account.   Please arrive 15 minutes prior to your appointment time for registration and insurance purposes  Follow-Up: At [facility], you and your health needs are our priority.  As part of our continuing mission to provide you with exceptional heart care, we have created designated Provider Care Teams.  These Care Teams include your primary Cardiologist (physician) and Advanced Practice Providers (APPs -  Physician Assistants and Nurse Practitioners) who all work together to provide you with the care you need, when you need it.     Your next appointment:   12 month(s)  The format for your next appointment:   In Person  Provider:   Glenetta Hew, MD  Other Instructions

## 2022-08-18 NOTE — Progress Notes (Unsigned)
Primary Care Provider: Aquilla Hacker, MD [facility] Cardiologist: None Electrophysiologist: None  Clinic Note: No chief complaint on file.   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 male with a PMH below who presents today for delayed annual f/u at the request of Aquilla Hacker, MD.  David Hogan was last seen on ***  Recent Hospitalizations: ***  Reviewed  CV studies:    The following studies were reviewed today: (if available, images/films reviewed: From Epic Chart or Care Everywhere) ***:  Interval History:   David Hogan   CV Review of Symptoms (Summary): Cardiovascular ROS: {roscv:310661}  REVIEWED OF SYSTEMS   ROS  I have reviewed and (if needed) personally updated the patient's problem list, medications, allergies, past medical and surgical history, social and family history.   PAST MEDICAL HISTORY   Past Medical History:  Diagnosis Date   Asthma    CAD, multiple vessel not amenable to PCI 04/12/2013   Referred for CABG -- Most recent Myoview 09/2016 - No ischemia or infarct (but abnormal EKG)   Complication of anesthesia    "woke up wild in recovery after gallbladder OR" (04/08/2013)   Exertional shortness of breath    History of: Non-STEMI (non-ST elevated myocardial infarction) 04/08/2013   Down in the setting of syncope in reaction to heat exhaustion plus multiple bee stings   Hypertension    Mild aortic stenosis    Very Mild AS on Echo  09/2016   OSA on CPAP    S/P CABG x 3, LIMA-LAD; VG-Diag; 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 Otherwise mild diffuse disease in the circumflex and RCA just region.   CARPAL TUNNEL RELEASE Left 1990's   CHOLECYSTECTOMY  ~ 2010   CORONARY ARTERY BYPASS GRAFT N/A 04/11/2013   Procedure: CORONARY ARTERY BYPASS GRAFTING (CABGx3: LIMA-LAD, SVG-RI, SVG-D1) ;  Surgeon: Grace Isaac, MD;  Location: [place];  Service: Open Heart Surgery;  Laterality: N/A;  x3 using right greater saphenous vein and left internal mammary.   Gastric bypass  07/09/2019   Gastric bypass sleeve surgery ([place]; Dr. Hoyle Barr)   [place]  ~ 2012   "from the gallbladder OR" (04/08/2013)   INCISION AND DRAINAGE OF WOUND  ~ 1996   "hit elbow at work; it swelled up; had it opened up to get pus out" (04/08/2013)   INTRAOPERATIVE TRANSESOPHAGEAL ECHOCARDIOGRAM N/A 04/11/2013   Procedure: INTRAOPERATIVE TRANSESOPHAGEAL ECHOCARDIOGRAM;  Surgeon: Grace Isaac, MD;  Location: [place];  Service: Open Heart Surgery;  Laterality: N/A;   LEFT HEART CATHETERIZATION WITH CORONARY ANGIOGRAM N/A 04/09/2013   Procedure: LEFT HEART CATHETERIZATION WITH CORONARY ANGIOGRAM;  Surgeon: Leonie Man, MD;  Location: [facility] CATH [facility];  Service: Cardiovascular;  Laterality: N/A;   NM MYOVIEW LTD  09/2016   Normal EF 61%. Horizontal ST segment depression noted in inferior and lateral leads with no evidence of ischemia or infarction on imaging. LOW RISK.   SHOULDER OPEN ROTATOR CUFF REPAIR Bilateral 1990's-2000's   TRANSTHORACIC ECHOCARDIOGRAM  09/'14; 2/'18:   a) EF 65-70%. Normal wall motion. Grade 1 diastolic dysfunction. Aortic sclerosis but no stenosis;; b) Normal LVEF 60-65%. Normal diastolic function. (Poor  acoustic windows). Very Mild AS   TRANSTHORACIC ECHOCARDIOGRAM  05/2019   Echocardiogram: EF 60 to 65%.  Moderate posterior wall thickness but no evidence of LVH.  GR 1 DD.  Normal RV size and function.  Normal atrial size.  Normal mitral valve.  Moderate thickening and calcification of the noncoronary cusp of the aortic valve => MILD AORTIC STENOSIS (mean gradient 13.5 mmHg.  (Stable from 2018).    Immunization History  Administered Date(s) Administered   PFIZER(Purple Top)SARS-COV-2 Vaccination 11/13/2019    MEDICATIONS/ALLERGIES   Current Meds  Medication Sig   aprepitant (EMEND) 40 MG capsule Take 40 mg by mouth every morning.   aspirin EC 81 MG tablet Take 1 tablet (81 mg total) by mouth daily.   atorvastatin (LIPITOR) 80 MG tablet Take 1 tablet (80 mg total) by mouth daily. Please keep upcoimg appointment   benzonatate (TESSALON) 200 MG capsule Take 200 mg by mouth once as needed for cough.   Blood Pressure Monitor DEVI Prescription for blood cuff automatic -- Monitor blood pressure 3 - 4 times a week and record   cyanocobalamin 100 MCG tablet Take by mouth.   EPIPEN 2-PAK 0.3 MG/0.3ML SOAJ injection    fenofibrate 160 MG tablet Take 1 tablet (160 mg total) by mouth daily.   HONEY BEE VENOM IJ Every 2 months   hydrochlorothiazide (HYDRODIURIL) 25 MG tablet TAKE ONE TABLET BY MOUTH EVERY DAY   ibuprofen (ADVIL) 800 MG tablet Take by mouth.   levofloxacin (LEVAQUIN) 500 MG tablet Take 500 mg by mouth at bedtime.   losartan (COZAAR) 100 MG tablet Take 100 mg by mouth daily.   metoprolol tartrate (LOPRESSOR) 25 MG tablet TAKE ONE TABLET BY MOUTH 2 TIMES A DAY   montelukast (SINGULAIR) 10 MG tablet Take 10 mg by mouth every morning.    nitroGLYCERIN (NITROSTAT) 0.4 MG SL tablet PLACE 1 TABLET UNDER THE TONGUE EVERY 5 MINUTES AS NEEDED FOR CHEST PAIN   Spacer/Aero-Holding Chambers (AEROCHAMBER PLUS FLO-VU) MISC Take 2 puffs by mouth as needed (SOB).    Vitamin D3 (VITAMIN D) 25 MCG  tablet Take one tablet (2,000 Units dose) by mouth 2 (two) times daily.    Allergies  Allergen Reactions   Bee Venom Other (See Comments)    Yellow jackets    SOCIAL HISTORY/FAMILY HISTORY   Reviewed in Epic:  Pertinent findings:  Social History   Tobacco Use   Smoking status: Never   Smokeless tobacco: Never  Substance Use Topics   Alcohol use: Yes    Comment: 04/09/2013 "last drink was in 1978 or so; never had a problem w/it"   Drug use: No   Social History   Social History Narrative   Married for 32 years. No living children. The 2 grandchildren.    He is a retired Biochemist, clinical from [employer]   He never smoked. Does not drink alcohol.   He currently walks 2-3 times a day since discharge.   He now wears a baseball hat with several bees on it that says "who knew"    OBJCTIVE -PE, EKG,  labs   Wt Readings from Last 3 Encounters:  08/18/22 223 lb 9.6 oz (101.4 kg)  05/11/21 203 lb (92.1 kg)  02/04/21 208 lb 3.2 oz (94.4 kg)    Physical Exam: BP (!) 142/78   Pulse 81   Ht 5\' 8"  (1.727 m)   Wt 223 lb 9.6 oz (101.4 kg)   SpO2 98%   BMI 34.00 kg/m  Physical Exam   Adult ECG Report  Rate: *** ;  Rhythm: {rhythm:17366};   Narrative Interpretation: ***  Recent Labs:  reviewed  [place] to Lipid Panel Component 06/26/22 11/28/21  Cholesterol, Total 103 99 Low   Triglycerides 82 73  HDL 36 Low  40  VLDL Cholesterol Cal 17 15  LDL 50 44    Comp. Metabolic Panel (14) Component 06/26/22 11/28/21 06/27/21 11/24/20  Glucose 88 86 88 77  BUN 13 16 16 14   Creatinine 0.88 0.87 0.92 0.97  eGFR 90 91 88 83  Interpretation Comment  -- Comment  --  BUN/Creatinine Ratio 15 18 17 14   Sodium 144 140 144 144  Potassium 4.3 3.9 4.1 3.7  Chloride 104 105 105 104  CO2 25 23 26 22   CALCIUM 9.1 8.8 9.0 9.1  Total Protein 6.3 6.6 6.7 6.7  Albumin, Serum 4.3 4.3 4.7 4.3  Globulin, Total 2.0 2.3 2.0 2.4  Albumin/Globulin Ratio 2.2 1.9 2.4  High  1.8  Total Bilirubin 1.4 High  1.3 High  1.2 0.9  Alkaline Phosphatase 69 72 76 90  AST 34 27 32 34  ALT (SGPT) 31 28 39 35    CBC No Differential/Platelet Component 06/26/22 11/28/21 06/27/21  WBC 5.6 5.8 4.8  RBC 4.64 4.62 4.49  Hemoglobin 14.2 -- 14.1  Hematocrit 42.1 -- 40.2    ================================================== I spent a total of ***minutes with the patient spent in direct patient consultation.  Additional time spent with chart review  / charting (studies, outside notes, etc): *** min Total Time: *** min  Current medicines are reviewed at length with the patient today.  (+/- concerns) ***  Notice: This dictation was prepared with Dragon dictation along with smart phrase technology. Any transcriptional errors that result from this process are unintentional and may not be corrected upon review.  Studies Ordered:   No orders of the defined types were placed in this encounter.  No orders of the defined types were placed in this encounter.   Patient Instructions / Medication Changes & Studies & Tests Ordered   Shared Decision Making/Informed Consent The risks [chest pain, shortness of breath, cardiac arrhythmias, dizziness, blood pressure fluctuations, myocardial infarction, stroke/transient ischemic attack, nausea, vomiting, allergic reaction, radiation exposure, metallic taste sensation and life-threatening complications (estimated to be 1 in 10,000)], benefits (risk stratification, diagnosing coronary artery disease, treatment guidance) and alternatives of a nuclear stress test were discussed in detail with Mr. Hanssen and he agrees to proceed.   There are no Patient Instructions on file for this visit.     Leonie Man, MD, MS Glenetta Hew, M.D., M.S. Interventional Cardiologist  [facility]  Pager # [phone] Phone # [phone] [address] [place], [place] [address]   Thank you for choosing [facility]!!

## 2022-08-19 ENCOUNTER — Encounter: Payer: Self-pay | Admitting: Cardiology

## 2022-08-19 NOTE — Assessment & Plan Note (Addendum)
Multivessel disease status post CABG.  Has done well with no active symptoms besides some exertional dyspnea.  Last Myoview was in 2018, nonischemic.  Was due for Myoview last year but was not seen on last year. = Just about 6 years from most recent ischemic evaluation.    Plan:  Will plan for surveillance Treadmill Myoview stress test. Continue maintenance aspirin  Continue lipid management with atorvastatin along with fenofibrate.  On last check, well-controlled. Continue Lopressor and losartan, if blood pressure were too elevated, would potentially consider increasing metoprolol. Continue to encourage weight maintenance and exercise.   Shared Decision Making/Informed Consent The risks [chest pain, shortness of breath, cardiac arrhythmias, dizziness, blood pressure fluctuations, myocardial infarction, stroke/transient ischemic attack, nausea, vomiting, allergic reaction, radiation exposure, metallic taste sensation and life-threatening complications (estimated to be 1 in 10,000)], benefits (risk stratification, diagnosing coronary artery disease, treatment guidance) and alternatives of a nuclear stress test were discussed in detail with David Hogan and he agrees to proceed.

## 2022-08-19 NOTE — Assessment & Plan Note (Signed)
Recent labs look great.  Low bit higher than earlier last year, still LDL 50.

## 2022-08-19 NOTE — Assessment & Plan Note (Signed)
For your follow-up was supposed to have been in February 2022, we are now almost 2 years later.  Will proceed with surveillance Myoview.

## 2022-08-19 NOTE — Assessment & Plan Note (Signed)
Still using CPAP.

## 2022-08-19 NOTE — Assessment & Plan Note (Signed)
Blood pressure is little high today, he says it is usually better at home.  He is on high-dose losartan along with HCTZ and low-dose metoprolol/Lopressor.  Will monitor closely, when he sees his PCP, could consider either titrating up dose of Lopressor, or converting Lopressor to carvedilol for better blood pressure control

## 2022-08-19 NOTE — Assessment & Plan Note (Signed)
Truthfully, I do not know if it was a real non-ST elevation myocardial infarction/ACS.  More likely consistent with demand ischemia. Found to have multivessel disease with no obvious culprit lesion Preserved EF with no infarct noted on Myoview.  Due for surveillance Myoview for CAD, and echocardiogram for aortic stenosis.

## 2022-08-19 NOTE — Assessment & Plan Note (Signed)
Probably still related to weight and some deconditioning, however is due for surveillance Myoview and echocardiogram.

## 2022-08-19 NOTE — Assessment & Plan Note (Signed)
Relatively low gradient on echo noted back in October 2020.  Plan would have been to assess October 2023, however that was delayed until now.  Will go ahead and order 2D echo to reassess aortic stenosis.  The murmur does sound a little louder which could be a factor of his weight loss versus progression of disease.

## 2022-08-22 ENCOUNTER — Telehealth (HOSPITAL_COMMUNITY): Payer: Self-pay

## 2022-08-22 NOTE — Telephone Encounter (Signed)
Detailed instructions left on the patient's answering machine. Asked to call back with any questions. S.Babette Stum EMTP

## 2022-08-23 ENCOUNTER — Ambulatory Visit (HOSPITAL_COMMUNITY): Payer: Medicare Other | Attending: Cardiology

## 2022-08-23 DIAGNOSIS — I25119 Atherosclerotic heart disease of native coronary artery with unspecified angina pectoris: Secondary | ICD-10-CM | POA: Diagnosis present

## 2022-08-23 DIAGNOSIS — R0609 Other forms of dyspnea: Secondary | ICD-10-CM | POA: Diagnosis not present

## 2022-08-23 DIAGNOSIS — Z951 Presence of aortocoronary bypass graft: Secondary | ICD-10-CM | POA: Diagnosis not present

## 2022-08-23 LAB — MYOCARDIAL PERFUSION IMAGING
Angina Index: 0
Base ST Depression (mm): 0 mm
Duke Treadmill Score: 6
Estimated workload: 7
Exercise duration (min): 6 min
Exercise duration (sec): 0 s
LV dias vol: 88 mL (ref 62–150)
LV sys vol: 39 mL
MPHR: 146 {beats}/min
Nuc Stress EF: 56 %
Peak HR: 139 {beats}/min
Percent HR: 95 %
Rest HR: 67 {beats}/min
Rest Nuclear Isotope Dose: 10.1 mCi
SDS: 0
SRS: 0
SSS: 0
ST Depression (mm): 0 mm
Stress Nuclear Isotope Dose: 29.4 mCi
TID: 0.93

## 2022-08-23 MED ORDER — TECHNETIUM TC 99M TETROFOSMIN IV KIT
29.4000 | PACK | Freq: Once | INTRAVENOUS | Status: AC | PRN
  Administered 2022-08-23: 29.4 via INTRAVENOUS

## 2022-08-23 MED ORDER — TECHNETIUM TC 99M TETROFOSMIN IV KIT
10.1000 | PACK | Freq: Once | INTRAVENOUS | Status: AC | PRN
  Administered 2022-08-23: 10.1 via INTRAVENOUS

## 2022-09-14 ENCOUNTER — Ambulatory Visit (HOSPITAL_COMMUNITY): Payer: Medicare Other | Attending: Cardiovascular Disease

## 2022-09-14 DIAGNOSIS — I25119 Atherosclerotic heart disease of native coronary artery with unspecified angina pectoris: Secondary | ICD-10-CM

## 2022-09-14 DIAGNOSIS — I350 Nonrheumatic aortic (valve) stenosis: Secondary | ICD-10-CM

## 2022-09-14 DIAGNOSIS — R0609 Other forms of dyspnea: Secondary | ICD-10-CM | POA: Diagnosis present

## 2022-09-14 LAB — ECHOCARDIOGRAM COMPLETE
AV Mean grad: 8 mmHg
AV Peak grad: 13.6 mmHg
Ao pk vel: 1.85 m/s
Area-P 1/2: 2.68 cm2
S' Lateral: 2.9 cm

## 2022-09-14 MED ORDER — PERFLUTREN LIPID MICROSPHERE
1.0000 mL | INTRAVENOUS | Status: AC | PRN
  Administered 2022-09-14: 2 mL via INTRAVENOUS

## 2022-10-18 ENCOUNTER — Other Ambulatory Visit: Payer: Self-pay | Admitting: Cardiology

## 2022-12-04 DIAGNOSIS — Z789 Other specified health status: Secondary | ICD-10-CM | POA: Insufficient documentation

## 2023-08-22 ENCOUNTER — Ambulatory Visit: Payer: Medicare Other | Attending: Cardiology | Admitting: Cardiology

## 2023-08-22 ENCOUNTER — Encounter: Payer: Self-pay | Admitting: Cardiology

## 2023-08-22 VITALS — BP 150/70 | HR 61 | Ht 67.0 in | Wt 233.0 lb

## 2023-08-22 DIAGNOSIS — Z951 Presence of aortocoronary bypass graft: Secondary | ICD-10-CM | POA: Diagnosis not present

## 2023-08-22 DIAGNOSIS — I2510 Atherosclerotic heart disease of native coronary artery without angina pectoris: Secondary | ICD-10-CM | POA: Diagnosis not present

## 2023-08-22 DIAGNOSIS — I25119 Atherosclerotic heart disease of native coronary artery with unspecified angina pectoris: Secondary | ICD-10-CM | POA: Diagnosis not present

## 2023-08-22 DIAGNOSIS — I10 Essential (primary) hypertension: Secondary | ICD-10-CM | POA: Diagnosis not present

## 2023-08-22 DIAGNOSIS — E66811 Obesity, class 1: Secondary | ICD-10-CM

## 2023-08-22 DIAGNOSIS — I214 Non-ST elevation (NSTEMI) myocardial infarction: Secondary | ICD-10-CM | POA: Diagnosis not present

## 2023-08-22 DIAGNOSIS — E785 Hyperlipidemia, unspecified: Secondary | ICD-10-CM

## 2023-08-22 DIAGNOSIS — I350 Nonrheumatic aortic (valve) stenosis: Secondary | ICD-10-CM

## 2023-08-22 LAB — COMPREHENSIVE METABOLIC PANEL
ALT: 25 [IU]/L (ref 0–44)
AST: 26 [IU]/L (ref 0–40)
Albumin: 4.3 g/dL (ref 3.8–4.8)
Alkaline Phosphatase: 70 [IU]/L (ref 44–121)
BUN/Creatinine Ratio: 9 — ABNORMAL LOW (ref 10–24)
BUN: 9 mg/dL (ref 8–27)
Bilirubin Total: 1.3 mg/dL — ABNORMAL HIGH (ref 0.0–1.2)
CO2: 26 mmol/L (ref 20–29)
Calcium: 9.3 mg/dL (ref 8.6–10.2)
Chloride: 104 mmol/L (ref 96–106)
Creatinine, Ser: 0.96 mg/dL (ref 0.76–1.27)
Globulin, Total: 2.2 g/dL (ref 1.5–4.5)
Glucose: 92 mg/dL (ref 70–99)
Potassium: 4.9 mmol/L (ref 3.5–5.2)
Sodium: 144 mmol/L (ref 134–144)
Total Protein: 6.5 g/dL (ref 6.0–8.5)
eGFR: 82 mL/min/{1.73_m2} (ref >59)

## 2023-08-22 LAB — LIPID PANEL
Chol/HDL Ratio: 2.8 {ratio} (ref 0.0–5.0)
Cholesterol, Total: 116 mg/dL (ref 100–199)
HDL: 42 mg/dL (ref >39)
LDL Chol Calc (NIH): 59 mg/dL (ref 0–99)
Triglycerides: 73 mg/dL (ref 0–149)
VLDL Cholesterol Cal: 15 mg/dL (ref 5–40)

## 2023-08-22 MED ORDER — VALSARTAN 320 MG PO TABS: 320.0000 mg | ORAL_TABLET | Freq: Every day | ORAL | 3 refills | Status: DC

## 2023-08-22 NOTE — Patient Instructions (Addendum)
 Medication Instructions:    Stop taking Losartan     Start taking  Valsartan  320 mg   - for the first week take 1/2 tablet   then increase to whole tablet.  Start keeping a blood pressure log twice a day - for the next mont.  Take bloddpressure twice a day a week prior to you seing Pharmacist in the [facility]  clinic-Hypertension   *If you need a refill on your cardiac medications before your next appointment, please call your pharmacy*   Lab Work: fasting CMP LIPID If you have labs (blood work) drawn today and your tests are completely normal, you will receive your results only by: MyChart Message (if you have MyChart) OR A paper copy in the mail If you have any lab test that is abnormal or we need to change your treatment, we will call you to review the results.   Testing/Procedures: Not needed   Follow-Up: At [facility], you and your health needs are our priority.  As part of our continuing mission to provide you with exceptional heart care, we have created designated Provider Care Teams.  These Care Teams include your primary Cardiologist (physician) and Advanced Practice Providers (APPs -  Physician Assistants and Nurse Practitioners) who all work together to provide you with the care you need, when you need it.     Your next appointment:   6 month(s)  The format for your next appointment:   In Person  Provider:   Randene Bustard, MD   You have been referred to [facility] - one month  for hypertension and med adjustment if needed   Other Instructions    Start keeping a blood pressure log twice a day - for the next mont.  Take bloddpressure twice a day a week prior to you seing Pharmacist in the [facility]  clinic-Hypertension

## 2023-08-22 NOTE — Progress Notes (Signed)
Cardiology Office Note:  .   Date:  08/23/2023  ID:  David Hogan, DOB 18-Dec-1945, MRN [id number] PCP: Ardelle Balls, MD  [facility] Providers Cardiologist:  Bryan Lemma, MD     Chief Complaint  Patient presents with   Follow-up    Annual follow-up.  Doing well.  Recently had procedure for CPAP treatment   Coronary Artery Disease    No angina.  Stress test reviewed    Patient Profile: .     David Hogan is a (formerly morbidly obese) 78 y.o. male with PMH summarized below.  Who presents here for annual f/u at the request of Ardelle Balls, MD.  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 NO CV concerns. Breathing notably improved with wgt loss.  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 (Considering Pannectomy).  Was using CPAP - considering Inspire. Negative CV ROS. Surveillance Myoview & Echo ordered   Seen on 07/03/23 by Dr. Janee Morn from [facility] for OSA - Hypoglossal Nerve Stim (Inspire) Insertion.  Subjective  Discussed the use of AI scribe software for clinical note transcription with the patient, who gave verbal consent to proceed.  History of Present Illness   David Hogan, a patient with a history of CAD-CABG (diagnosed following a severe allergic reaction to multiple Bee/Hornet stings leading to Demand Ischemia),   Mild AS with soft SEM murmur, hypertension, along with sleep apnea, (s/p Inspire/Hypoglossal Nerve Stimulator insertion ~ 4 four months ago) presents for annual follow-up. He reports satisfaction with the Inspire insertion procedure, noting a significant improvement in sleep quality without the need for a CPAP machine. There have been no reported issues or complications  post-procedure.  The patient denies experiencing any episodes of chest pain, shortness of breath, or palpitations. There have been no recent incidents of bee stings, which have previously caused health issues. The patient also underwent a stress test and echocardiogram for surveillance of aortic valve stability, which were reassuring. However, there is a noted discrepancy in the echocardiogram report regarding severe left ventricular hypertrophy (LVH), which the patient's provider suspects may be a typographical error.  The patient also has a history of kidney stones, which are currently asymptomatic. Blood pressure readings have been consistently high during recent medical visits, although the patient attributes this to anxiety related to doctor's appointments. The patient has not been monitoring blood pressure at home recently.  The patient's current medication regimen includes low-dose aspirin, atorvastatin, hydrochlorothiazide, losartan, and metoprolol. The patient is compliant with the medication regimen. However, due to persistently high blood pressure readings, there is a plan to switch the patient to a more potent antihypertensive medication.      ROS:  Review of Systems - Negative except chronic nasal congestion, somewhat stagnated weight loss     Objective   Studies Reviewed: [name]   EKG Interpretation Date/Time:  Wednesday August 22 2023 09:28:02 EST Ventricular Rate:  61 PR Interval:  144 QRS Duration:  100 QT Interval:  428 QTC Calculation: 430 R Axis:   10  Text Interpretation: Normal sinus rhythm T wave abnormality, consider lateral ischemia When compared with ECG of 12-Apr-2013 06:53, Vent. rate has decreased BY  37 BPM ST no longer elevated in Anterolateral leads T  wave inversion now evident in Lateral leads Confirmed by Bryan Lemma ([id number]) on 08/22/2023 9:50:06 AM   ECHO 09/14/22: LVEF 60-65%. No RWMA. Severe LVH - ?? Normal Diastolic parameters with normal LA size (does not  make sense). Mod-Severely Calcified AoV with Mod Thickening. MILD AS.   Myoview 08/23/2022: LOW RISK. No Ischemia or Infarction.  EF 55-65%.   Lab Results  Component Value Date   NA 144 08/22/2023   K 4.9 08/22/2023   CREATININE 0.96 08/22/2023   EGFR 82 08/22/2023   GLUCOSE 92 08/22/2023      Latest Ref Rng & Units 04/15/2013    5:17 AM 04/13/2013    4:16 AM 04/12/2013    5:42 PM  CBC  WBC 4.0 - 10.5 K/uL 9.5  16.6    Hemoglobin 13.0 - 17.0 g/dL 60.4  54.0  98.1   Hematocrit 39.0 - 52.0 % 35.6  36.9  40.0   Platelets 150 - 400 K/uL 186  118     .[name] Risk Assessment/Calculations:     HYPERTENSION CONTROL Vitals:   08/22/23 0929 08/22/23 1009 08/23/23 0035  BP: (!) 150/60 (!) 150/70 (!) 150/70    The patient's blood pressure is elevated above target today.  In order to address the patient's elevated BP: A current anti-hypertensive medication was adjusted today.; The blood pressure is usually elevated in clinic.  Blood pressures monitored at home have been optimal.; A referral to the PharmD Hypertension Clinic will be placed. (Converting from losartan 100 mg daily to valsartan 320 mg daily -> follow-up with [facility] Pharm.D. clinic)           Physical Exam:   VS:  BP (!) 150/70   Pulse 61   Ht 5\' 7"  (1.702 m)   Wt 233 lb (105.7 kg)   SpO2 99%   BMI 36.49 kg/m    Wt Readings from Last 3 Encounters:  08/22/23 233 lb (105.7 kg)  08/23/22 223 lb (101.2 kg)  08/18/22 223 lb 9.6 oz (101.4 kg)    GEN: Well nourished, well developed in no acute distress; still moderately obese, but notably less obese than before.;  Still has this nothing from his chronic congestion issues NECK: No JVD; No carotid bruits CARDIAC: RRR, Normal S1, S2; 2/6 SEM -RUSB-> Neck.  No additional murmurs, rubs, gallops RESPIRATORY:  Clear to auscultation without rales, wheezing or rhonchi ; nonlabored, good air movement. ABDOMEN: Soft, non-tender, non-distended; NABS. EXTREMITIES:  No edema; No deformity      ASSESSMENT AND PLAN: .    Problem List Items Addressed This Visit       Cardiology Problems   Essential hypertension (Chronic)   Elevated blood pressure readings in the office. Patient reports not regularly checking blood pressure at home.  Currently on Losartan 100 mg daily, HCTZ 25 mg daily, and and metoprolol tartrate 25 mg twice daily. -Discontinue Losartan and initiate Diovan 360mg . Start with half a tablet for the first week, then increase to a full tablet. -Advise patient to monitor blood pressure at home intermittently in the morning and evening for the next week, and daily in the morning and evening the week before the next visit. -Schedule follow-up visit with clinical pharmacist in one month to recheck chemistry panel and adjust medications as needed. => Could potentially convert from Lopressor to carvedilol as well as potentially add amlodipine.      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  Has had negative Myoview since CABG and has had normal EF with no wall motion normalities indicating no true damage done.      Relevant Medications   valsartan (DIOVAN) 320 MG tablet   Other Relevant Orders   EKG 12-Lead (Completed)   Hyperlipidemia with target low density lipoprotein (LDL) cholesterol less than 55 mg/dL (Chronic)   No recent cholesterol check. Remains on atorvastatin 80 mg daily along with fenofibrate 160 mg daily with no myalgias or arthralgias. -Order lipid panel and chemistry panel today.      Relevant Medications   valsartan (DIOVAN) 320 MG tablet   Other Relevant Orders   EKG 12-Lead (Completed)   AMB Referral to [facility] Pharm-D    Lipid panel (Completed)   Comprehensive metabolic panel (Completed)   Mild aortic valve stenosis (Chronic)   Stable mild AS on Echo February 2024. -Prevention, we will reassess in 3 years, unless murmur increases.      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  He has done great ever since CABG with no major complaints. Surveillance Myoview in January 2024 was low risk with no ischemia infarction normal EF. He is still asymptomatic but I am a little bit concerned about his blood pressure being elevated and his EKG showing some biphasic anterior ST-T segments.  I am not having symptoms I do not think we need to do any further evaluation for now.  Plan: Continue maintenance regimen of atorvastatin 80 mg daily along with fenofibrate 160 mg daily along with 25 mg twice daily Lopressor. rechecking lipids and LFTs With elevated BP, will convert from losartan 100 mg daily to valsartan 320 mg daily-and referring to [facility] Pharm.[facility]. clinic Continue to encourage weight management with diet and exercise.      Relevant Medications   valsartan (DIOVAN) 320 MG tablet     Other   Obesity (BMI 30.0-34.9) (Chronic)   Gained 10 pounds since his visit last year-after having lost a significant of weight following GOP surgery.  Still moderately obese but no longer morbidly obese.  Continue encourage diet modification and increased exercise.      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 Will be due for next surveillance test January 2028 unless symptoms warrant.      Relevant Orders   EKG 12-Lead (Completed)     Follow-Up: Return in about 1 month (around 09/22/2023)  for BP follow-up with [facility], 6 month follow-up with me.to reassess patient's condition and medication adjustments.       I spent 42 minutes in the care of David Hogan today including reviewing outside labs from [facility]- (2 minutes), reviewing studies (7 minutes), face to face time discussing treatment options (21 minutes), 12 minutes dictating, and documenting in the encounter.    Signed, Marykay Lex, MD, MS Bryan Lemma, M.D., M.S. Interventional Cardiologist  [facility]  Pager # [phone] Phone # [phone] [address] [place], [place] [address]

## 2023-08-23 ENCOUNTER — Encounter: Payer: Self-pay | Admitting: Cardiology

## 2023-08-23 NOTE — Assessment & Plan Note (Signed)
Type II demand ischemia in the setting of severe allergic reaction to bee stings led to ischemic evaluation indicating multivessel disease.  Has had negative Myoview since CABG and has had normal EF with no wall motion normalities indicating no true damage done.

## 2023-08-23 NOTE — Assessment & Plan Note (Signed)
Gained 10 pounds since his visit last year-after having lost a significant of weight following GOP surgery.  Still moderately obese but no longer morbidly obese.  Continue encourage diet modification and increased exercise.

## 2023-08-23 NOTE — Assessment & Plan Note (Signed)
No recent cholesterol check. Remains on atorvastatin 80 mg daily along with fenofibrate 160 mg daily with no myalgias or arthralgias. -Order lipid panel and chemistry panel today.

## 2023-08-23 NOTE — Assessment & Plan Note (Signed)
Surveillance Myoview in January 2024 nonischemic. Will be due for next surveillance test January 2028 unless symptoms warrant.

## 2023-08-23 NOTE — Assessment & Plan Note (Signed)
Stable mild AS on Echo February 2024. -Prevention, we will reassess in 3 years, unless murmur increases.

## 2023-08-23 NOTE — Assessment & Plan Note (Addendum)
He never actually truly had any anginal symptoms with exception of having some shortness of breath and tightness with multiple bee stings.  He has done great ever since CABG with no major complaints. Surveillance Myoview in January 2024 was low risk with no ischemia infarction normal EF. He is still asymptomatic but I am a little bit concerned about his blood pressure being elevated and his EKG showing some biphasic anterior ST-T segments.  I am not having symptoms I do not think we need to do any further evaluation for now.  Plan: Continue maintenance regimen of atorvastatin 80 mg daily along with fenofibrate 160 mg daily along with 25 mg twice daily Lopressor. rechecking lipids and LFTs With elevated BP, will convert from losartan 100 mg daily to valsartan 320 mg daily-and referring to [facility] Pharm.D. clinic Continue to encourage weight management with diet and exercise.

## 2023-08-23 NOTE — Assessment & Plan Note (Signed)
Elevated blood pressure readings in the office. Patient reports not regularly checking blood pressure at home.  Currently on Losartan 100 mg daily, HCTZ 25 mg daily, and and metoprolol tartrate 25 mg twice daily. -Discontinue Losartan and initiate Diovan 360mg . Start with half a tablet for the first week, then increase to a full tablet. -Advise patient to monitor blood pressure at home intermittently in the morning and evening for the next week, and daily in the morning and evening the week before the next visit. -Schedule follow-up visit with clinical pharmacist in one month to recheck chemistry panel and adjust medications as needed. => Could potentially convert from Lopressor to carvedilol as well as potentially add amlodipine.

## 2023-09-04 ENCOUNTER — Telehealth: Payer: Self-pay | Admitting: *Deleted

## 2023-09-04 NOTE — Telephone Encounter (Signed)
Called left detail message of  lab results on voicemail per DPR.  - may review via mychart  and may call with any questions

## 2023-09-04 NOTE — Telephone Encounter (Signed)
-----   Message from Bryan Lemma sent at 08/22/2023 10:46 PM EST ----- Lab results showed normal chemistry panel with normal kidney function and liver function.  Normal electrolytes.  Cholesterol level looks great with LDL pretty much within target range at 59, with [place] target being less than 55 and goal being less than 70. All the other good news is that the HDL level is actually up from 30 to now 42 which is protective.  Continue current medications, and keep working on diet/exercise and weight loss.Bryan Lemma, MD

## 2023-09-27 ENCOUNTER — Ambulatory Visit: Payer: Medicare Other | Attending: Internal Medicine | Admitting: Pharmacist

## 2023-09-27 ENCOUNTER — Encounter: Payer: Self-pay | Admitting: Pharmacist

## 2023-09-27 ENCOUNTER — Ambulatory Visit: Payer: Medicare Other

## 2023-09-27 VITALS — BP 155/76 | HR 60

## 2023-09-27 DIAGNOSIS — I10 Essential (primary) hypertension: Secondary | ICD-10-CM

## 2023-09-27 MED ORDER — CARVEDILOL 6.25 MG PO TABS
6.2500 mg | ORAL_TABLET | Freq: Two times a day (BID) | ORAL | 2 refills | Status: DC
Start: 2023-09-27 — End: 2023-12-17

## 2023-09-27 NOTE — Patient Instructions (Addendum)
It was nice meeting you today  We would like your blood pressure to be less than 130/80  Please continue your: Hydrochlorothiazide 25mg  in the morning Valsartan 320mg  in the morning  Please discontinue your metoprolol at this time and we will change to carvedilol 6.25mg  twice a day  Continue to check your blood pressure and heart rate at home  The number to reset your mychart password is  [phone])  Please give me a call with any questions or concerns  Laural Golden, PharmD, BCACP, CDCES, CPP [address] [place], [place], [address] Phone: [phone], Fax: [phone]

## 2023-09-27 NOTE — Progress Notes (Signed)
Patient ID: [name]                 DOB: 1945/11/18                      MRN: [id number]     HPI: David Hogan is a 78 y.o. male referred by Dr. Herbie Baltimore to HTN clinic. PMH is significant for HTN, CAD, NSTEMI, hx of CABG, HTN, OSA, and obesity post bariatric surgery.  Patient seen by Dr Herbie Baltimore on 08/22/23 and was hypertensive. Losartan was switched to valsartan 320mg  once daily.  Patient presents today for HTN follow up. Tolerating valsartan well. No patient reported adverse effects. Also remains on metoprolol tartrate 25mg  BID and hydrochlorothiazide 25mg  daily.   He reports he feels great. Denies chest pain, SOB, swelling, or dizziness. Cares for his wife of 48 years who has dementia and this can sometimes be stressful for him.  Does not like the taste of salt so he does not add it to his food. Was previously over 300# before surgery. Denies alcohol and tobacco use. Does drink 3-4 cups of coffee a day however. He reports he is physically active.  Home readings variable: 2/18: 154/71 2/17: 134/72 2/16: 147/68 2/15: 129/70 2/14: 122/68 2/13: 138/68   Current HTN meds:  Hydrochlorothiazide 25mg  daily Metoprolol 25mg  BID Valsartan 320mg  daily  BP goal: <130/80  Wt Readings from Last 3 Encounters:  08/22/23 233 lb (105.7 kg)  08/23/22 223 lb (101.2 kg)  08/18/22 223 lb 9.6 oz (101.4 kg)   BP Readings from Last 3 Encounters:  09/27/23 (!) 155/76  08/23/23 (!) 150/70  08/18/22 (!) 142/78   Pulse Readings from Last 3 Encounters:  09/27/23 60  08/22/23 61  08/18/22 81    Renal function: CrCl cannot be calculated (Patient's most recent lab result is older than the maximum 21 days allowed.).  Past Medical History:  Diagnosis Date   Asthma    CAD, multiple vessel not amenable to PCI 04/12/2013   Referred for CABG -- Most recent Myoview 09/2016 - No ischemia or infarct (but abnormal EKG)   Complication of anesthesia    "woke up wild in recovery after gallbladder  OR" (04/08/2013)   Exertional shortness of breath    History of: Non-STEMI (non-ST elevated myocardial infarction) 04/08/2013   Down in the setting of syncope in reaction to heat exhaustion plus multiple bee stings   Hypertension    Mild aortic stenosis    Very Mild AS on Echo 09/2016   OSA on CPAP    S/P CABG x 3, LIMA-LAD; VG-Diag; 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     aspirin EC 81 MG tablet Take 1 tablet (81 mg total) by mouth daily. 90 tablet 3   atorvastatin (LIPITOR) 80 MG tablet Take 1 tablet (80 mg total) by mouth daily. 90 tablet 3   benzonatate (TESSALON) 200 MG capsule Take 200 mg by mouth once as needed for cough.     Blood Pressure Monitor DEVI Prescription for blood cuff automatic -- Monitor blood pressure 3 - 4 times a week and record 1 each 0   cyanocobalamin 100 MCG tablet Take by mouth.     EPIPEN 2-PAK 0.3 MG/0.3ML SOAJ injection      fenofibrate 160 MG tablet Take 1 tablet (160 mg  total) by mouth daily. 90 tablet 2   HONEY BEE VENOM IJ Every 2 months     hydrochlorothiazide (HYDRODIURIL) 25 MG tablet TAKE ONE TABLET BY MOUTH EVERY DAY 30 tablet 0   ibuprofen (ADVIL) 800 MG tablet Take by mouth.     levofloxacin (LEVAQUIN) 500 MG tablet Take 500 mg by mouth at bedtime.     montelukast (SINGULAIR) 10 MG tablet Take 10 mg by mouth every morning.      nitroGLYCERIN (NITROSTAT) 0.4 MG SL tablet PLACE 1 TABLET UNDER THE TONGUE EVERY 5 MINUTES AS NEEDED FOR CHEST PAIN 25 tablet 3   Spacer/Aero-Holding Chambers (AEROCHAMBER PLUS FLO-VU) MISC Take 2 puffs by mouth as needed (SOB).      valsartan (DIOVAN) 320 MG tablet Take 1 tablet (320 mg total) by mouth daily. 90 tablet 3   Vitamin D3 (VITAMIN D) 25 MCG tablet Take one tablet (2,000 Units dose) by mouth 2 (two) times daily.     No current facility-administered  medications on file prior to visit.    Allergies  Allergen Reactions   Bee Venom Other (See Comments)    Yellow jackets     Assessment/Plan:  1. Hypertension -  HYPERTENSION CONTROL Vitals:   09/27/23 1538 09/27/23 1547  BP: (!) 151/83 (!) 155/76    The patient's blood pressure is elevated above target today.  In order to address the patient's elevated BP: A current anti-hypertensive medication was adjusted today.    Patient BP in room elevated today at 151/83 which is above goal of <130/80. Higher than home readings although they are variable. Will switch metoprolol to carvedilol 6.25mg  BID and recheck in 1 month with home BP log. Patient voiced understanding.  Continue hydrochlorothiazide 25mg  daily Continue valsartan 320mg  daily D/c metoprolol Start carvedilol 6.25mg  BID Recheck in 1 month   Laural Golden, PharmD, [place], CDCES, CPP [address] [place], [place], [address] Phone: [phone], Fax: [phone]

## 2023-10-01 ENCOUNTER — Other Ambulatory Visit: Payer: Self-pay | Admitting: Cardiology

## 2023-10-26 ENCOUNTER — Ambulatory Visit: Payer: Medicare Other | Attending: Cardiology | Admitting: Pharmacist

## 2023-10-26 VITALS — BP 142/86 | HR 68

## 2023-10-26 DIAGNOSIS — I10 Essential (primary) hypertension: Secondary | ICD-10-CM

## 2023-10-26 DIAGNOSIS — I214 Non-ST elevation (NSTEMI) myocardial infarction: Secondary | ICD-10-CM | POA: Diagnosis not present

## 2023-10-26 DIAGNOSIS — J301 Allergic rhinitis due to pollen: Secondary | ICD-10-CM | POA: Insufficient documentation

## 2023-10-26 DIAGNOSIS — J4520 Mild intermittent asthma, uncomplicated: Secondary | ICD-10-CM | POA: Insufficient documentation

## 2023-10-26 MED ORDER — VALSARTAN 320 MG PO TABS: ORAL_TABLET | ORAL | Status: DC

## 2023-10-26 MED ORDER — HYDROCHLOROTHIAZIDE 25 MG PO TABS
25.0000 mg | ORAL_TABLET | Freq: Every day | ORAL | 1 refills | Status: DC
Start: 2023-10-26 — End: 2024-04-11

## 2023-10-26 NOTE — Patient Instructions (Addendum)
 Good seeing you again this morning  We would like your blood pressure to stay less than 130/80  Your blood pressure is a little elevated today but your home readings look improved  Switch your valsartan 320mg  to the evening  Continue your carvedilol 6.25mg  twice a day and your hydrochlorothiazide 25mg  in the morning  Please contact us with any questions or concerns and we will see you back in 2 months  Laural Golden, PharmD, BCACP, CDCES, CPP [address] [place], [place], [address] Phone: [phone], Fax: [phone]

## 2023-10-26 NOTE — Progress Notes (Signed)
 Patient ID: David Hogan                 DOB: May 13, 1946                      MRN: [id number]     HPI: David Hogan is a 78 y.o. male referred by Dr. Herbie Baltimore to HTN clinic. PMH is significant for HTN, CAD, NSTEMI, hx of CABG, HTN, OSA, and obesity post bariatric surgery.  Patient seen by Dr Herbie Baltimore on 08/22/23 and was hypertensive. Losartan was switched to valsartan 320mg  once daily.  Patient presents today for HTN follow up. Tolerating valsartan well. No patient reported adverse effects. Also remains on metoprolol tartrate 25mg  BID and hydrochlorothiazide 25mg  daily.   He reports he feels great. Denies chest pain, SOB, swelling, or dizziness. Cares for his wife of 48 years who has dementia and this can sometimes be stressful for him.  Does not like the taste of salt so he does not add it to his food. Was previously over 300# before surgery. Denies alcohol and tobacco use. Does drink 3-4 cups of coffee a day however in the morning. He reports he is physically active.  Takes all medications in the mornings.   Current HTN meds:  Hydrochlorothiazide 25mg  daily Carvedilol 6.25mg  BID Valsartan 320mg  daily  BP goal: <130/80  Wt Readings from Last 3 Encounters:  08/22/23 233 lb (105.7 kg)  08/23/22 223 lb (101.2 kg)  08/18/22 223 lb 9.6 oz (101.4 kg)   BP Readings from Last 3 Encounters:  09/27/23 (!) 155/76  08/23/23 (!) 150/70  08/18/22 (!) 142/78   Pulse Readings from Last 3 Encounters:  09/27/23 60  08/22/23 61  08/18/22 81    Renal function: CrCl cannot be calculated (Patient's most recent lab result is older than the maximum 21 days allowed.).  Past Medical History:  Diagnosis Date   Asthma    CAD, multiple vessel not amenable to PCI 04/12/2013   Referred for CABG -- Most recent Myoview 09/2016 - No ischemia or infarct (but abnormal EKG)   Complication of anesthesia    "woke up wild in recovery after gallbladder OR" (04/08/2013)   Exertional shortness of breath     History of: Non-STEMI (non-ST elevated myocardial infarction) 04/08/2013   Down in the setting of syncope in reaction to heat exhaustion plus multiple bee stings   Hypertension    Mild aortic stenosis    Very Mild AS on Echo 09/2016   OSA on CPAP    S/P CABG x 3, LIMA-LAD; VG-Diag; 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     aspirin EC 81 MG tablet Take 1 tablet (81 mg total) by mouth daily. 90 tablet 3   atorvastatin (LIPITOR) 80 MG tablet Take 1 tablet (80 mg total) by mouth daily. 90 tablet 3   benzonatate (TESSALON) 200 MG capsule Take 200 mg by mouth once as needed for cough.     Blood Pressure Monitor DEVI Prescription for blood cuff automatic -- Monitor blood pressure 3 - 4 times a week and record 1 each 0   cyanocobalamin 100 MCG tablet Take by mouth.     EPIPEN 2-PAK 0.3 MG/0.3ML SOAJ injection      fenofibrate 160 MG tablet Take 1 tablet (160 mg total) by mouth daily. 90 tablet  2   HONEY BEE VENOM IJ Every 2 months     hydrochlorothiazide (HYDRODIURIL) 25 MG tablet TAKE ONE TABLET BY MOUTH EVERY DAY 30 tablet 0   ibuprofen (ADVIL) 800 MG tablet Take by mouth.     levofloxacin (LEVAQUIN) 500 MG tablet Take 500 mg by mouth at bedtime.     montelukast (SINGULAIR) 10 MG tablet Take 10 mg by mouth every morning.      nitroGLYCERIN (NITROSTAT) 0.4 MG SL tablet PLACE 1 TABLET UNDER THE TONGUE EVERY 5 MINUTES AS NEEDED FOR CHEST PAIN 25 tablet 3   Spacer/Aero-Holding Chambers (AEROCHAMBER PLUS FLO-VU) MISC Take 2 puffs by mouth as needed (SOB).      valsartan (DIOVAN) 320 MG tablet Take 1 tablet (320 mg total) by mouth daily. 90 tablet 3   Vitamin D3 (VITAMIN D) 25 MCG tablet Take one tablet (2,000 Units dose) by mouth 2 (two) times daily.     No current facility-administered medications on file prior to visit.    Allergies   Allergen Reactions   Bee Venom Other (See Comments)    Yellow jackets     Assessment/Plan:  1. Hypertension -  Patient BP 142/86 which is above goal of <130/80. Patient's blood pressure varies throughout the day. Will have him move valsartan to the evening and continue hydrochlorothiazide in the morning. Carvedilol continue twice daily. Recheck in 6 weeks.  Addendum - patient called back and reports he has been out of hydrochlorothiazide. Will refill.  Continue hydrochlorothiazide 25mg  once daily Continue carvedilol 6.25mg  BID Change valsartan 320mg  to evening Recheck in 6 weeks  Laural Golden, PharmD, BCACP, CDCES, CPP [address], [address] [place], [place], [address] Phone: [phone], Fax: [phone]

## 2023-12-16 ENCOUNTER — Other Ambulatory Visit: Payer: Self-pay | Admitting: Cardiology

## 2023-12-16 DIAGNOSIS — I10 Essential (primary) hypertension: Secondary | ICD-10-CM

## 2023-12-27 ENCOUNTER — Ambulatory Visit: Attending: Cardiology | Admitting: Pharmacist

## 2023-12-27 VITALS — BP 144/74 | HR 69

## 2023-12-27 DIAGNOSIS — I10 Essential (primary) hypertension: Secondary | ICD-10-CM

## 2023-12-27 NOTE — Patient Instructions (Addendum)
 It was good seeing you again  We would like your blood pressure to be less than 130/80  It is slightly elevated today but your home readings look very good  Continue your:  Hydrochlorothiazide  25mg  in the morning Carvedilol  6.25mg  twice a day Valsartan  320mg  in the evening  Continue to monitor at home and let us  know if it start to trend upwards  Chris Kleigh Hoelzer, PharmD, BCACP, CDCES, CPP [address], [address] [place], [place], [address] Phone: [phone], Fax: [phone]

## 2023-12-27 NOTE — Progress Notes (Unsigned)
 Patient ID: David Hogan                 DOB: 07-19-1946                      MRN: [id number]     HPI: David Hogan is a 78 y.o. male referred by Dr. Addie Holstein to HTN clinic. PMH is significant for HTN, CAD, NSTEMI, hx of CABG, HTN, OSA, and obesity post bariatric surgery.  Patient seen by Dr Addie Holstein on 08/22/23 and was hypertensive. Losartan  was switched to valsartan  320mg  once daily.  Patient presents today for HTN follow up. Tolerating valsartan  well since switching to evening No patient reported adverse effects. Also takes carvedilol  6.25mg  BID and hydrochlorothiazide  25mg  daily.   He reports he feels great. Denies chest pain, SOB, swelling, or dizziness. Cares for his wife of 48 years who has dementia and this can sometimes be stressful for him. Believes his occasional elevated home readings may be due to caretaker stress.  Does not like the taste of salt so he does not add it to his food. Was previously over 300# before surgery. Denies alcohol and tobacco use. Has reduced his coffee in take from 3-4 cups a day to 1-2. Does not drink soft drinks.  Home readings: 5/21: 122/57 5/20: 150/62 5/19: 135/71 5/18: 121/59 5/18: 125/61 5/17: 132/60   Current HTN meds:  Hydrochlorothiazide  25mg  in the morning Carvedilol  6.25mg  BID Valsartan  320mg  in the evening  BP goal: <130/80  Wt Readings from Last 3 Encounters:  08/22/23 233 lb (105.7 kg)  08/23/22 223 lb (101.2 kg)  08/18/22 223 lb 9.6 oz (101.4 kg)   BP Readings from Last 3 Encounters:  09/27/23 (!) 155/76  08/23/23 (!) 150/70  08/18/22 (!) 142/78   Pulse Readings from Last 3 Encounters:  09/27/23 60  08/22/23 61  08/18/22 81    Renal function: CrCl cannot be calculated (Patient's most recent lab result is older than the maximum 21 days allowed.).  Past Medical History:  Diagnosis Date   Asthma    CAD, multiple vessel not amenable to PCI 04/12/2013   Referred for CABG -- Most recent Myoview  09/2016 - No ischemia  or infarct (but abnormal EKG)   Complication of anesthesia    "woke up wild in recovery after gallbladder OR" (04/08/2013)   Exertional shortness of breath    History of: Non-STEMI (non-ST elevated myocardial infarction) 04/08/2013   Down in the setting of syncope in reaction to heat exhaustion plus multiple bee stings   Hypertension    Mild aortic stenosis    Very Mild AS on Echo 09/2016   OSA on CPAP    S/P CABG x 3, LIMA-LAD; VG-Diag; 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     aspirin  EC 81 MG tablet Take 1 tablet (81 mg total) by mouth daily. 90 tablet 3   atorvastatin  (LIPITOR) 80 MG tablet Take 1 tablet (80 mg total) by mouth daily. 90 tablet 3   benzonatate (TESSALON) 200 MG capsule Take 200 mg by mouth once as needed for cough.     Blood Pressure Monitor DEVI Prescription for blood cuff automatic -- Monitor blood pressure 3 - 4 times a week and record 1 each 0   cyanocobalamin 100 MCG tablet Take by mouth.  EPIPEN  2-PAK 0.3 MG/0.3ML SOAJ injection      fenofibrate  160 MG tablet Take 1 tablet (160 mg total) by mouth daily. 90 tablet 2   HONEY BEE VENOM IJ Every 2 months     hydrochlorothiazide  (HYDRODIURIL ) 25 MG tablet TAKE ONE TABLET BY MOUTH EVERY DAY 30 tablet 0   ibuprofen (ADVIL) 800 MG tablet Take by mouth.     levofloxacin (LEVAQUIN) 500 MG tablet Take 500 mg by mouth at bedtime.     montelukast  (SINGULAIR ) 10 MG tablet Take 10 mg by mouth every morning.      nitroGLYCERIN  (NITROSTAT ) 0.4 MG SL tablet PLACE 1 TABLET UNDER THE TONGUE EVERY 5 MINUTES AS NEEDED FOR CHEST PAIN 25 tablet 3   Spacer/Aero-Holding Chambers (AEROCHAMBER PLUS FLO-VU) MISC Take 2 puffs by mouth as needed (SOB).      valsartan  (DIOVAN ) 320 MG tablet Take 1 tablet (320 mg total) by mouth daily. 90 tablet 3   Vitamin D3 (VITAMIN D) 25 MCG tablet  Take one tablet (2,000 Units dose) by mouth 2 (two) times daily.     No current facility-administered medications on file prior to visit.    Allergies  Allergen Reactions   Bee Venom Other (See Comments)    Yellow jackets     Assessment/Plan:  1. Hypertension -  Patient BP 144/74 which is above goal of <130/80. Home readings better controlled.  Diastolic readings actually trending low. Will not make any adjustments today since patient feels well and do not want to risk hypotension. Patient requests call in a month to check BP readings.  Continue hydrochlorothiazide  25mg  once daily Continue carvedilol  6.25mg  BID Change valsartan  320mg  to evening Call in 1 month  Joelene Murrain, PharmD, [place], CDCES, CPP [address] [place], [place], [address] Phone: [phone], Fax: [phone]

## 2024-01-02 ENCOUNTER — Encounter: Payer: Self-pay | Admitting: Pharmacist

## 2024-04-11 ENCOUNTER — Other Ambulatory Visit: Payer: Self-pay | Admitting: Cardiology

## 2024-04-11 DIAGNOSIS — I10 Essential (primary) hypertension: Secondary | ICD-10-CM

## 2024-07-27 ENCOUNTER — Other Ambulatory Visit: Payer: Self-pay | Admitting: Cardiology
